# Patient Record
Sex: Female | Born: 1955
Health system: Southern US, Community
[De-identification: ages and names within clinical notes are randomized; demographics above are authoritative.]

## PROBLEM LIST (undated history)

## (undated) ENCOUNTER — Ambulatory Visit (HOSPITAL_COMMUNITY): Source: Home / Self Care

## (undated) DIAGNOSIS — I499 Cardiac arrhythmia, unspecified: Secondary | ICD-10-CM

## (undated) DIAGNOSIS — I214 Non-ST elevation (NSTEMI) myocardial infarction: Secondary | ICD-10-CM

## (undated) DIAGNOSIS — I10 Essential (primary) hypertension: Secondary | ICD-10-CM

## (undated) DIAGNOSIS — E785 Hyperlipidemia, unspecified: Secondary | ICD-10-CM

## (undated) HISTORY — DX: Non-ST elevation (NSTEMI) myocardial infarction: I21.4

## (undated) HISTORY — PX: FOOT SURGERY: SHX648

## (undated) HISTORY — DX: Cardiac arrhythmia, unspecified: I49.9

## (undated) HISTORY — DX: Essential (primary) hypertension: I10

## (undated) HISTORY — DX: Hyperlipidemia, unspecified: E78.5

---

## 2010-04-18 ENCOUNTER — Emergency Department (HOSPITAL_COMMUNITY): Admission: EM | Admit: 2010-04-18 | Discharge: 2010-04-18 | Payer: Self-pay | Admitting: Emergency Medicine

## 2011-02-24 LAB — CBC
MCHC: 34.1 g/dL (ref 30.0–36.0)
MCV: 88.2 fL (ref 78.0–100.0)
RDW: 13.8 % (ref 11.5–15.5)

## 2011-02-24 LAB — BASIC METABOLIC PANEL
BUN: 13 mg/dL (ref 6–23)
Calcium: 9.5 mg/dL (ref 8.4–10.5)
Creatinine, Ser: 1.03 mg/dL (ref 0.4–1.2)
GFR calc non Af Amer: 56 mL/min — ABNORMAL LOW (ref >60)
Glucose, Bld: 96 mg/dL (ref 70–99)

## 2011-02-24 LAB — DIFFERENTIAL
Basophils Relative: 1 % (ref 0–1)
Eosinophils Absolute: 0 10*3/uL (ref 0.0–0.7)
Lymphocytes Relative: 39 % (ref 12–46)
Lymphs Abs: 1.7 10*3/uL (ref 0.7–4.0)
Monocytes Absolute: 0.4 10*3/uL (ref 0.1–1.0)
Monocytes Relative: 11 % (ref 3–12)
Neutrophils Relative %: 49 % (ref 43–77)

## 2011-02-24 LAB — POCT CARDIAC MARKERS: Troponin i, poc: <0.05 ng/mL (ref 0.00–0.09)

## 2018-10-18 DIAGNOSIS — I456 Pre-excitation syndrome: Secondary | ICD-10-CM | POA: Diagnosis not present

## 2018-10-18 DIAGNOSIS — H6502 Acute serous otitis media, left ear: Secondary | ICD-10-CM | POA: Diagnosis not present

## 2018-10-18 DIAGNOSIS — I471 Supraventricular tachycardia: Secondary | ICD-10-CM | POA: Diagnosis not present

## 2018-10-18 DIAGNOSIS — Z131 Encounter for screening for diabetes mellitus: Secondary | ICD-10-CM | POA: Diagnosis not present

## 2018-10-18 DIAGNOSIS — J069 Acute upper respiratory infection, unspecified: Secondary | ICD-10-CM | POA: Diagnosis not present

## 2018-10-18 DIAGNOSIS — Z113 Encounter for screening for infections with a predominantly sexual mode of transmission: Secondary | ICD-10-CM | POA: Diagnosis not present

## 2018-10-18 DIAGNOSIS — I10 Essential (primary) hypertension: Secondary | ICD-10-CM | POA: Diagnosis not present

## 2018-10-18 DIAGNOSIS — R000 Tachycardia, unspecified: Secondary | ICD-10-CM | POA: Diagnosis not present

## 2018-10-18 DIAGNOSIS — E7800 Pure hypercholesterolemia, unspecified: Secondary | ICD-10-CM | POA: Diagnosis not present

## 2018-10-25 DIAGNOSIS — I471 Supraventricular tachycardia: Secondary | ICD-10-CM | POA: Diagnosis not present

## 2018-10-25 DIAGNOSIS — I10 Essential (primary) hypertension: Secondary | ICD-10-CM | POA: Diagnosis not present

## 2018-11-01 DIAGNOSIS — E7800 Pure hypercholesterolemia, unspecified: Secondary | ICD-10-CM | POA: Diagnosis not present

## 2018-11-01 DIAGNOSIS — I119 Hypertensive heart disease without heart failure: Secondary | ICD-10-CM | POA: Diagnosis not present

## 2018-11-01 DIAGNOSIS — I471 Supraventricular tachycardia: Secondary | ICD-10-CM | POA: Diagnosis not present

## 2018-11-01 DIAGNOSIS — I456 Pre-excitation syndrome: Secondary | ICD-10-CM | POA: Diagnosis not present

## 2018-11-01 DIAGNOSIS — I10 Essential (primary) hypertension: Secondary | ICD-10-CM | POA: Diagnosis not present

## 2018-11-01 DIAGNOSIS — Z6830 Body mass index (BMI) 30.0-30.9, adult: Secondary | ICD-10-CM | POA: Diagnosis not present

## 2018-11-25 DIAGNOSIS — I472 Ventricular tachycardia: Secondary | ICD-10-CM | POA: Diagnosis not present

## 2019-01-26 DIAGNOSIS — R7303 Prediabetes: Secondary | ICD-10-CM | POA: Diagnosis not present

## 2019-01-26 DIAGNOSIS — R309 Painful micturition, unspecified: Secondary | ICD-10-CM | POA: Diagnosis not present

## 2019-01-26 DIAGNOSIS — L853 Xerosis cutis: Secondary | ICD-10-CM | POA: Diagnosis not present

## 2019-01-26 DIAGNOSIS — I10 Essential (primary) hypertension: Secondary | ICD-10-CM | POA: Diagnosis not present

## 2019-02-25 NOTE — Progress Notes (Deleted)
Subjective:  Primary Physician:  No primary care provider on file.  Patient ID: Veronica RoughenBrenda Wanat, female    DOB: [birth year], 63 y.o.   MRN: [id number]  No chief complaint on file.   HPI: Veronica Bowen  is a 63 y.o. female, came for follow-up for Supraventricular tachycardia. [name] is 63 years old African-American female. She was seen for the first time on 10/18/18 when patient was having PSVT run continuously for more than one week. She was having shortness of breath with PSVT. Carotid massage was tried but it did not work. Patient was then given IV adenosine in the office and she converted to sinus rhythm.  Patient is feeling well now. In her own words "I feel a whole lot better". She did not have any further palpitation, rapid heart beat since the last visit. No c/o dizziness, near-syncope or syncope. No complaints of chest pain, shortness of breath, orthopnea or PND.  Patient has history of rapid heartbeat off and on for many years, spells had been rare and short lasting. Once, she had long spell of rapid heartbeat, patient went to ER and was diagnosed as PSVT, treated with IV medication.  No complaints of swelling on the legs, no claudication.  No history of diabetes. She has hypertension. She also has history of hypercholesterolemia, patient says that I took medicines in the past but then stopped on her own. She does not smoke. She does not drink coffee or other caffeine containing beverages and no history of taking decongestants.   No history of thyroid problems. No history of TIA or CVA. ***  No past medical history on file.  *** The histories are not reviewed yet. Please review them in the "History" navigator section and refresh this SmartLink.  Social History   Socioeconomic History  . Marital status: Single    Spouse name: Not on file  . Number of children: Not on file  . Years of education: Not on file  . Highest education level: Not on file  Occupational  History  . Not on file  Social Needs  . Financial resource strain: Not on file  . Food insecurity:    Worry: Not on file    Inability: Not on file  . Transportation needs:    Medical: Not on file    Non-medical: Not on file  Tobacco Use  . Smoking status: Not on file  Substance and Sexual Activity  . Alcohol use: Not on file  . Drug use: Not on file  . Sexual activity: Not on file  Lifestyle  . Physical activity:    Days per week: Not on file    Minutes per session: Not on file  . Stress: Not on file  Relationships  . Social connections:    Talks on phone: Not on file    Gets together: Not on file    Attends religious service: Not on file    Active member of club or organization: Not on file    Attends meetings of clubs or organizations: Not on file    Relationship status: Not on file  . Intimate partner violence:    Fear of current or ex partner: Not on file    Emotionally abused: Not on file    Physically abused: Not on file    Forced sexual activity: Not on file  Other Topics Concern  . Not on file  Social History Narrative  . Not on file    No current outpatient medications on  file prior to visit.   No current facility-administered medications on file prior to visit.     ***Review of Systems  Constitutional: Negative for fever.  HENT: Negative for nosebleeds.   Eyes: Negative for blurred vision.  Respiratory: Negative for cough.   Gastrointestinal: Negative for abdominal pain, nausea and vomiting.  Genitourinary: Negative for dysuria.  Musculoskeletal: Negative for myalgias.  Skin: Negative for itching and rash.  Neurological: Negative for dizziness and loss of consciousness.  Endo/Heme/Allergies: Does not bruise/bleed easily.  Psychiatric/Behavioral: The patient is not nervous/anxious.        Objective:  There were no vitals taken for this visit. There is no height or weight on file to calculate BMI.  ***Physical Exam  Constitutional: She is  oriented to person, place, and time. She appears well-developed and well-nourished.  HENT:  Head: Normocephalic and atraumatic.  Eyes: Pupils are equal, round, and reactive to light.  Neck: No thyromegaly present.  Cardiovascular: Normal rate, regular rhythm and normal heart sounds. Exam reveals no gallop.  No murmur heard. Pulses:      Carotid pulses are 2+ on the right side and 2+ on the left side.      Femoral pulses are 2+ on the right side and 2+ on the left side.      Dorsalis pedis pulses are 2+ on the right side and 2+ on the left side.       Posterior tibial pulses are 2+ on the right side and 2+ on the left side.  Pulmonary/Chest: She has no wheezes. She has no rales.  Abdominal: She exhibits no mass. There is no abdominal tenderness.  Musculoskeletal:        General: No edema.  Lymphadenopathy:    She has no cervical adenopathy.  Neurological: She is alert and oriented to person, place, and time.  Skin: Skin is warm.    CARDIAC STUDIES:  Echocardiogram 10/25/2018: Left ventricle cavity is normal in size. Moderate concentric hypertrophy of the left ventricle. Normal global wall motion. Doppler evidence of grade I (impaired) diastolic dysfunction, normal LAP. Calculated EF 55%. Occasional PVC noted. Trileaflet aortic valve with mild (Grade I) regurgitation. Mild calcification of the aortic valve annulus. Mild (Grade I) mitral regurgitation. Mild mitral valve leaflet thickening. Trace tricuspid regurgitation. Unable to estimate PA pressure due to absence/minimal TR signal.  Treadmill exercise stress test 11/25/2018: Indication: SVT The patient exercised on Bruce protocol for 04:36 min. Patient achieved 6.57 METS and reached HR 125 bpm, which is 79 % of maximum age-predicted HR. Stress test terminated due to fatigue.  Resting EKG demonstrates Normal sinus rhythm. ST Changes: With peak exercise there was no ST-T changes of ischemia. Arrhythmias: none. Chest Pain: none. BP  Response to Exercise: Normal resting BP- appropriate response. HR Response to Exercise: Exaggerated HR response. Exercise capacity was below average for age. Recommendations: Continue primary/secondary prevention.   Assessment & Recommendations:   There are no diagnoses linked to this encounter.  Recommendation: *** Since patient had such long spell of SVT lasting for one week with symptoms of shortness of breath, has short PR interval on EKG. I have recommended ablation. It was explained to the patient. I will refer her for EP consult after stress test.  Primary prevention was again discussed with her. She was advised to follow low-salt, low-cholesterol diet. She was also advised calorie restriction to lose weight.  I will see her in follow-up after 3 months, but patient was advised to call us if there are any  spells of rapid heartbeat or any other cardiac problems in the interim.  Earl Many, MD, [facility] 02/25/2019, 9:01 PM [facility] [place] Pager: [phone] Office: [phone] If no answer Cell [phone]

## 2019-02-27 ENCOUNTER — Ambulatory Visit: Payer: Self-pay | Admitting: Cardiology

## 2019-02-27 DIAGNOSIS — I10 Essential (primary) hypertension: Secondary | ICD-10-CM | POA: Diagnosis not present

## 2019-02-27 DIAGNOSIS — R7303 Prediabetes: Secondary | ICD-10-CM | POA: Diagnosis not present

## 2019-03-30 DIAGNOSIS — Z7189 Other specified counseling: Secondary | ICD-10-CM | POA: Diagnosis not present

## 2019-03-30 DIAGNOSIS — I10 Essential (primary) hypertension: Secondary | ICD-10-CM | POA: Diagnosis not present

## 2019-03-30 DIAGNOSIS — R7303 Prediabetes: Secondary | ICD-10-CM | POA: Diagnosis not present

## 2019-06-23 DIAGNOSIS — R7303 Prediabetes: Secondary | ICD-10-CM | POA: Diagnosis not present

## 2019-06-23 DIAGNOSIS — I10 Essential (primary) hypertension: Secondary | ICD-10-CM | POA: Diagnosis not present

## 2019-06-23 DIAGNOSIS — D7289 Other specified disorders of white blood cells: Secondary | ICD-10-CM | POA: Diagnosis not present

## 2019-06-23 DIAGNOSIS — D72819 Decreased white blood cell count, unspecified: Secondary | ICD-10-CM | POA: Diagnosis not present

## 2019-06-23 DIAGNOSIS — E782 Mixed hyperlipidemia: Secondary | ICD-10-CM | POA: Diagnosis not present

## 2019-10-24 DIAGNOSIS — R7303 Prediabetes: Secondary | ICD-10-CM | POA: Diagnosis not present

## 2019-10-24 DIAGNOSIS — H6691 Otitis media, unspecified, right ear: Secondary | ICD-10-CM | POA: Diagnosis not present

## 2019-10-24 DIAGNOSIS — I10 Essential (primary) hypertension: Secondary | ICD-10-CM | POA: Diagnosis not present

## 2019-10-24 DIAGNOSIS — J029 Acute pharyngitis, unspecified: Secondary | ICD-10-CM | POA: Diagnosis not present

## 2019-10-24 DIAGNOSIS — E782 Mixed hyperlipidemia: Secondary | ICD-10-CM | POA: Diagnosis not present

## 2019-11-13 ENCOUNTER — Encounter: Payer: Self-pay | Admitting: Cardiology

## 2019-11-13 ENCOUNTER — Ambulatory Visit (INDEPENDENT_AMBULATORY_CARE_PROVIDER_SITE_OTHER): Payer: BC Managed Care – PPO | Admitting: Cardiology

## 2019-11-13 ENCOUNTER — Other Ambulatory Visit: Payer: Self-pay

## 2019-11-13 VITALS — BP 137/86 | HR 80 | Temp 97.2°F | Ht 65.0 in | Wt 188.6 lb

## 2019-11-13 DIAGNOSIS — E7800 Pure hypercholesterolemia, unspecified: Secondary | ICD-10-CM

## 2019-11-13 DIAGNOSIS — I471 Supraventricular tachycardia: Secondary | ICD-10-CM | POA: Diagnosis not present

## 2019-11-13 DIAGNOSIS — I10 Essential (primary) hypertension: Secondary | ICD-10-CM

## 2019-11-13 NOTE — Progress Notes (Signed)
Primary Physician:  Renaye RakersBland, Veita, MD   Patient ID: Veronica Bowen Haver, female    DOB: 1956-04-15, 63 y.o.   MRN: [id number]  Subjective:    Chief Complaint  Patient presents with  . Hypertension    HPI: Veronica Bowen  is a 63 y.o. female  with hypertension, hyperlipidemia, PSVT, last seen in November 2019 after she had prolonged episode of PSVT and was given IV adenosine in the office which converted to sinus rhythm.  She was lost to follow-up, but decided to make an appointment today to be seen as she had not been seen by us in some time.  She states that she is doing well, no complaints today.  She has not had any episodes of palpitations, rapid heartbeat, dizziness, or syncope.  She denies any chest pain or shortness of breath.  She states that she has recently seen her PCP and was started on Lipitor.  Past Medical History:  Diagnosis Date  . Hyperlipidemia   . Hypertension     Past Surgical History:  Procedure Laterality Date  . CESAREAN SECTION  1989    Social History   Socioeconomic History  . Marital status: Single    Spouse name: Not on file  . Number of children: 2  . Years of education: Not on file  . Highest education level: Not on file  Occupational History  . Not on file  Social Needs  . Financial resource strain: Not on file  . Food insecurity    Worry: Not on file    Inability: Not on file  . Transportation needs    Medical: Not on file    Non-medical: Not on file  Tobacco Use  . Smoking status: Never Smoker  . Smokeless tobacco: Never Used  Substance and Sexual Activity  . Alcohol use: Not on file    Comment: occ wine  . Drug use: Never  . Sexual activity: Not on file  Lifestyle  . Physical activity    Days per week: Not on file    Minutes per session: Not on file  . Stress: Not on file  Relationships  . Social Musicianconnections    Talks on phone: Not on file    Gets together: Not on file    Attends religious service: Not on file    Active member  of club or organization: Not on file    Attends meetings of clubs or organizations: Not on file    Relationship status: Not on file  . Intimate partner violence    Fear of current or ex partner: Not on file    Emotionally abused: Not on file    Physically abused: Not on file    Forced sexual activity: Not on file  Other Topics Concern  . Not on file  Social History Narrative  . Not on file    Review of Systems  Constitution: Negative for decreased appetite, malaise/fatigue, weight gain and weight loss.  Eyes: Negative for visual disturbance.  Cardiovascular: Negative for chest pain, claudication, dyspnea on exertion, leg swelling, orthopnea, palpitations and syncope.  Respiratory: Negative for hemoptysis and wheezing.   Endocrine: Negative for cold intolerance and heat intolerance.  Hematologic/Lymphatic: Does not bruise/bleed easily.  Skin: Negative for nail changes.  Musculoskeletal: Negative for muscle weakness and myalgias.  Gastrointestinal: Negative for abdominal pain, change in bowel habit, nausea and vomiting.  Neurological: Negative for difficulty with concentration, dizziness, focal weakness and headaches.  Psychiatric/Behavioral: Negative for altered mental status and suicidal ideas.  All other systems reviewed and are negative.     Objective:  Blood pressure 137/86, pulse 80, temperature (!) 97.2 F (36.2 C), height 5\' 5"  (1.651 m), weight 188 lb 9.6 oz (85.5 kg), SpO2 96 %. Body mass index is 31.38 kg/m.    Physical Exam  Constitutional: She is oriented to person, place, and time. Vital signs are normal. She appears well-developed and well-nourished.  HENT:  Head: Normocephalic and atraumatic.  Neck: Normal range of motion.  Cardiovascular: Normal rate, regular rhythm, normal heart sounds and intact distal pulses.  Pulmonary/Chest: Effort normal and breath sounds normal. No accessory muscle usage. No respiratory distress.  Abdominal: Soft. Bowel sounds are  normal.  Musculoskeletal: Normal range of motion.  Neurological: She is alert and oriented to person, place, and time.  Skin: Skin is warm and dry.  Vitals reviewed.  Radiology: No results found.  Laboratory examination:    CMP Latest Ref Rng & Units 04/18/2010  Glucose 70 - 99 mg/dL 96  BUN 6 - 23 mg/dL 13  Creatinine 0.4 - 1.2 mg/dL 04/20/2010  Sodium 2.53 - 664 mEq/L 141  Potassium 3.5 - 5.1 mEq/L 4.1  Chloride 96 - 112 mEq/L 106  CO2 19 - 32 mEq/L 29  Calcium 8.4 - 10.5 mg/dL 9.5   CBC Latest Ref Rng & Units 04/18/2010  WBC 4.0 - 10.5 K/uL 4.3  Hemoglobin 12.0 - 15.0 g/dL 04/20/2010  Hematocrit 47.4 - 46.0 % 40.0  Platelets 150 - 400 K/uL 179   Lipid Panel  No results found for: CHOL, TRIG, HDL, CHOLHDL, VLDL, LDLCALC, LDLDIRECT HEMOGLOBIN A1C No results found for: HGBA1C, MPG TSH No results for input(s): TSH in the last 8760 hours.  PRN Meds:. There are no discontinued medications. Current Meds  Medication Sig  . atorvastatin (LIPITOR) 40 MG tablet Take 40 mg by mouth at bedtime.  25.9 losartan-hydrochlorothiazide (HYZAAR) 50-12.5 MG tablet Take 1 tablet by mouth daily.    Cardiac Studies:   Echocardiogram 10/25/2018: Left ventricle cavity is normal in size. Moderate concentric hypertrophy of the left ventricle. Normal global wall motion. Doppler evidence of grade I (impaired) diastolic dysfunction, normal LAP. Calculated EF 55%. Occasional PVC noted. Trileaflet aortic valve with mild (Grade I) regurgitation. Mild calcification of the aortic valve annulus. Mild (Grade I) mitral regurgitation. Mild mitral valve leaflet thickening. Trace tricuspid regurgitation. Unable to estimate PA pressure due to absence/minimal TR signal.  Treadmill exercise stress test 11/25/2018: Indication: SVT The patient exercised on Bruce protocol for  04:36 min. Patient achieved  6.57 METS and reached HR  125 bpm, which is  79 % of maximum age-predicted HR.  Stress test terminated due to fatigue.    Resting EKG demonstrates Normal sinus rhythm. ST Changes: With peak exercise there was no ST-T changes of ischemia. Arrhythmias: none. Chest Pain: none.  BP Response to Exercise: Normal resting BP- appropriate response. HR Response to Exercise: Exaggerated HR response. Exercise capacity was below average for age. Recommendations: Continue primary/secondary prevention.  Assessment:   PSVT (paroxysmal supraventricular tachycardia) (HCC)  Essential hypertension - Plan: EKG 12-Lead  Hypercholesteremia  EKG 11/13/2019: Normal sinus rhythm at 78 bpm, normal axis, no evidence of ischemia.   Recommendations:   Patient is here for follow-up for hypertension and PSVT.  She has not had any known recurrence of SVT.  It appears that she is not currently on metoprolol, do feel that she would benefit from this given her history of SVT.  I will refill her prescription.  Discussed  use of vagal maneuvers should she have recurrence of SVT.  Continue with losartan hydrochlorothiazide.  She denies any chest pain or shortness of breath.  Physical exam is unremarkable and EKG is without any acute abnormalities.  Encouraged her to continue to work on her diet and start regular exercise to help with her weight.  She is on Lipitor for hyperlipidemia that is being managed by her PCP.  I will request recent labs from PCP office for our records.  I recommended that she keep continued regular follow-ups with her PCP.  I will see her back in 6 months for follow-up, but encouraged her to contact me sooner if needed.  Miquel Dunn, MSN, APRN, FNP-C [facility]. [place] Office: [phone] Fax: [phone]

## 2019-11-14 ENCOUNTER — Encounter: Payer: Self-pay | Admitting: Cardiology

## 2019-11-14 MED ORDER — METOPROLOL SUCCINATE ER 50 MG PO TB24: 50.0000 mg | ORAL_TABLET | Freq: Every day | ORAL | 3 refills | Status: DC

## 2020-01-29 DIAGNOSIS — E782 Mixed hyperlipidemia: Secondary | ICD-10-CM | POA: Diagnosis not present

## 2020-01-29 DIAGNOSIS — R7303 Prediabetes: Secondary | ICD-10-CM | POA: Diagnosis not present

## 2020-01-29 DIAGNOSIS — I10 Essential (primary) hypertension: Secondary | ICD-10-CM | POA: Diagnosis not present

## 2020-02-26 DIAGNOSIS — R519 Headache, unspecified: Secondary | ICD-10-CM | POA: Diagnosis not present

## 2020-02-26 DIAGNOSIS — Z03818 Encounter for observation for suspected exposure to other biological agents ruled out: Secondary | ICD-10-CM | POA: Diagnosis not present

## 2020-02-26 DIAGNOSIS — Z20828 Contact with and (suspected) exposure to other viral communicable diseases: Secondary | ICD-10-CM | POA: Diagnosis not present

## 2020-02-26 DIAGNOSIS — I10 Essential (primary) hypertension: Secondary | ICD-10-CM | POA: Diagnosis not present

## 2020-04-30 DIAGNOSIS — E782 Mixed hyperlipidemia: Secondary | ICD-10-CM | POA: Diagnosis not present

## 2020-04-30 DIAGNOSIS — R7303 Prediabetes: Secondary | ICD-10-CM | POA: Diagnosis not present

## 2020-04-30 DIAGNOSIS — I10 Essential (primary) hypertension: Secondary | ICD-10-CM | POA: Diagnosis not present

## 2020-05-13 ENCOUNTER — Ambulatory Visit: Payer: BC Managed Care – PPO | Admitting: Cardiology

## 2020-06-07 ENCOUNTER — Encounter (HOSPITAL_COMMUNITY): Payer: Self-pay

## 2020-06-07 ENCOUNTER — Emergency Department (HOSPITAL_COMMUNITY): Payer: BC Managed Care – PPO

## 2020-06-07 ENCOUNTER — Inpatient Hospital Stay (HOSPITAL_COMMUNITY)
Admission: EM | Admit: 2020-06-07 | Discharge: 2020-06-12 | DRG: 247 | Disposition: A | Discharge status: Home / Self Care | Payer: BC Managed Care – PPO | Attending: Internal Medicine | Admitting: Internal Medicine

## 2020-06-07 ENCOUNTER — Other Ambulatory Visit: Payer: Self-pay

## 2020-06-07 DIAGNOSIS — R079 Chest pain, unspecified: Secondary | ICD-10-CM

## 2020-06-07 DIAGNOSIS — N3000 Acute cystitis without hematuria: Secondary | ICD-10-CM | POA: Diagnosis not present

## 2020-06-07 DIAGNOSIS — I471 Supraventricular tachycardia, unspecified: Secondary | ICD-10-CM

## 2020-06-07 DIAGNOSIS — Z79899 Other long term (current) drug therapy: Secondary | ICD-10-CM

## 2020-06-07 DIAGNOSIS — I214 Non-ST elevation (NSTEMI) myocardial infarction: Secondary | ICD-10-CM | POA: Diagnosis not present

## 2020-06-07 DIAGNOSIS — R778 Other specified abnormalities of plasma proteins: Secondary | ICD-10-CM

## 2020-06-07 DIAGNOSIS — Z20822 Contact with and (suspected) exposure to covid-19: Secondary | ICD-10-CM | POA: Diagnosis present

## 2020-06-07 DIAGNOSIS — I10 Essential (primary) hypertension: Secondary | ICD-10-CM

## 2020-06-07 DIAGNOSIS — I2510 Atherosclerotic heart disease of native coronary artery without angina pectoris: Secondary | ICD-10-CM | POA: Diagnosis present

## 2020-06-07 DIAGNOSIS — E119 Type 2 diabetes mellitus without complications: Secondary | ICD-10-CM | POA: Diagnosis present

## 2020-06-07 DIAGNOSIS — E7800 Pure hypercholesterolemia, unspecified: Secondary | ICD-10-CM | POA: Diagnosis not present

## 2020-06-07 DIAGNOSIS — N390 Urinary tract infection, site not specified: Secondary | ICD-10-CM

## 2020-06-07 DIAGNOSIS — I110 Hypertensive heart disease with heart failure: Secondary | ICD-10-CM | POA: Diagnosis present

## 2020-06-07 DIAGNOSIS — R3915 Urgency of urination: Secondary | ICD-10-CM | POA: Diagnosis not present

## 2020-06-07 DIAGNOSIS — B9689 Other specified bacterial agents as the cause of diseases classified elsewhere: Secondary | ICD-10-CM | POA: Diagnosis not present

## 2020-06-07 DIAGNOSIS — I5032 Chronic diastolic (congestive) heart failure: Secondary | ICD-10-CM | POA: Diagnosis not present

## 2020-06-07 DIAGNOSIS — N3090 Cystitis, unspecified without hematuria: Secondary | ICD-10-CM | POA: Diagnosis not present

## 2020-06-07 DIAGNOSIS — E785 Hyperlipidemia, unspecified: Secondary | ICD-10-CM | POA: Diagnosis not present

## 2020-06-07 DIAGNOSIS — R7989 Other specified abnormal findings of blood chemistry: Secondary | ICD-10-CM

## 2020-06-07 DIAGNOSIS — N3001 Acute cystitis with hematuria: Secondary | ICD-10-CM | POA: Diagnosis not present

## 2020-06-07 DIAGNOSIS — R0789 Other chest pain: Secondary | ICD-10-CM | POA: Diagnosis not present

## 2020-06-07 DIAGNOSIS — Z955 Presence of coronary angioplasty implant and graft: Secondary | ICD-10-CM

## 2020-06-07 DIAGNOSIS — J9811 Atelectasis: Secondary | ICD-10-CM | POA: Diagnosis not present

## 2020-06-07 HISTORY — DX: Urinary tract infection, site not specified: N39.0

## 2020-06-07 HISTORY — DX: Supraventricular tachycardia, unspecified: I47.10

## 2020-06-07 HISTORY — DX: Chest pain, unspecified: R07.9

## 2020-06-07 HISTORY — DX: Supraventricular tachycardia: I47.1

## 2020-06-07 LAB — BASIC METABOLIC PANEL
Anion gap: 9 (ref 5–15)
BUN: 11 mg/dL (ref 8–23)
CO2: 27 mmol/L (ref 22–32)
Calcium: 9.7 mg/dL (ref 8.9–10.3)
Chloride: 104 mmol/L (ref 98–111)
Creatinine, Ser: 1.08 mg/dL — ABNORMAL HIGH (ref 0.44–1.00)
GFR calc Af Amer: >60 mL/min (ref >60)
GFR calc non Af Amer: 55 mL/min — ABNORMAL LOW (ref >60)
Glucose, Bld: 107 mg/dL — ABNORMAL HIGH (ref 70–99)
Potassium: 3.6 mmol/L (ref 3.5–5.1)
Sodium: 140 mmol/L (ref 135–145)

## 2020-06-07 LAB — URINALYSIS, ROUTINE W REFLEX MICROSCOPIC
Bilirubin Urine: NEGATIVE
Glucose, UA: NEGATIVE mg/dL
Ketones, ur: NEGATIVE mg/dL
Nitrite: POSITIVE — AB
Protein, ur: 30 mg/dL — AB
Specific Gravity, Urine: 1.017 (ref 1.005–1.030)
WBC, UA: >50 WBC/hpf — ABNORMAL HIGH (ref 0–5)
pH: 5 (ref 5.0–8.0)

## 2020-06-07 LAB — TROPONIN I (HIGH SENSITIVITY)
Troponin I (High Sensitivity): 35 ng/L — ABNORMAL HIGH (ref <18)
Troponin I (High Sensitivity): 678 ng/L (ref <18)

## 2020-06-07 LAB — CBC
HCT: 40.7 % (ref 36.0–46.0)
Hemoglobin: 12.8 g/dL (ref 12.0–15.0)
MCH: 27.7 pg (ref 26.0–34.0)
MCHC: 31.4 g/dL (ref 30.0–36.0)
MCV: 88.1 fL (ref 80.0–100.0)
Platelets: 199 10*3/uL (ref 150–400)
RBC: 4.62 MIL/uL (ref 3.87–5.11)
RDW: 13.3 % (ref 11.5–15.5)
WBC: 5.8 10*3/uL (ref 4.0–10.5)
nRBC: 0 % (ref 0.0–0.2)

## 2020-06-07 LAB — MRSA PCR SCREENING: MRSA by PCR: NEGATIVE

## 2020-06-07 LAB — SARS CORONAVIRUS 2 BY RT PCR (HOSPITAL ORDER, PERFORMED IN ~~LOC~~ HOSPITAL LAB): SARS Coronavirus 2: NEGATIVE

## 2020-06-07 MED ORDER — LOSARTAN POTASSIUM 50 MG PO TABS
50.0000 mg | ORAL_TABLET | Freq: Every day | ORAL | Status: DC
  Administered 2020-06-08 – 2020-06-12 (×4): 50 mg via ORAL
  Filled 2020-06-07 (×5): qty 1

## 2020-06-07 MED ORDER — CEPHALEXIN 500 MG PO CAPS
500.0000 mg | ORAL_CAPSULE | Freq: Two times a day (BID) | ORAL | Status: DC
  Administered 2020-06-07 – 2020-06-10 (×6): 500 mg via ORAL
  Filled 2020-06-07 (×6): qty 1

## 2020-06-07 MED ORDER — ROSUVASTATIN CALCIUM 20 MG PO TABS
40.0000 mg | ORAL_TABLET | Freq: Every day | ORAL | Status: DC
  Administered 2020-06-08 – 2020-06-12 (×4): 40 mg via ORAL
  Filled 2020-06-07 (×5): qty 2

## 2020-06-07 MED ORDER — HYDROCHLOROTHIAZIDE 25 MG PO TABS
12.5000 mg | ORAL_TABLET | Freq: Every day | ORAL | Status: DC
  Administered 2020-06-08 – 2020-06-12 (×4): 12.5 mg via ORAL
  Filled 2020-06-07 (×5): qty 1

## 2020-06-07 MED ORDER — HEPARIN BOLUS VIA INFUSION
4000.0000 [IU] | Freq: Once | INTRAVENOUS | Status: AC
  Administered 2020-06-07: 4000 [IU] via INTRAVENOUS
  Filled 2020-06-07: qty 4000

## 2020-06-07 MED ORDER — SODIUM CHLORIDE 0.9% FLUSH: 3.0000 mL | Freq: Once | INTRAVENOUS | Status: DC

## 2020-06-07 MED ORDER — ASPIRIN 81 MG PO CHEW
81.0000 mg | CHEWABLE_TABLET | Freq: Every day | ORAL | Status: DC
  Administered 2020-06-08 – 2020-06-12 (×4): 81 mg via ORAL
  Filled 2020-06-07 (×4): qty 1

## 2020-06-07 MED ORDER — ASPIRIN 81 MG PO CHEW
324.0000 mg | CHEWABLE_TABLET | Freq: Once | ORAL | Status: AC
  Administered 2020-06-07: 324 mg via ORAL
  Filled 2020-06-07: qty 4

## 2020-06-07 MED ORDER — NITROGLYCERIN 0.4 MG SL SUBL
0.4000 mg | SUBLINGUAL_TABLET | SUBLINGUAL | Status: DC | PRN
  Administered 2020-06-07: 0.4 mg via SUBLINGUAL
  Filled 2020-06-07: qty 1

## 2020-06-07 MED ORDER — METOPROLOL TARTRATE 25 MG PO TABS
25.0000 mg | ORAL_TABLET | Freq: Two times a day (BID) | ORAL | Status: DC
  Administered 2020-06-07 – 2020-06-12 (×9): 25 mg via ORAL
  Filled 2020-06-07 (×10): qty 1

## 2020-06-07 MED ORDER — ASPIRIN 81 MG PO CHEW: 324.0000 mg | CHEWABLE_TABLET | Freq: Once | ORAL | Status: DC

## 2020-06-07 MED ORDER — HEPARIN (PORCINE) 25000 UT/250ML-% IV SOLN
700.0000 [IU]/h | INTRAVENOUS | Status: DC
  Administered 2020-06-07: 1000 [IU]/h via INTRAVENOUS
  Administered 2020-06-08: 750 [IU]/h via INTRAVENOUS
  Administered 2020-06-10: 700 [IU]/h via INTRAVENOUS
  Filled 2020-06-07 (×3): qty 250

## 2020-06-07 NOTE — ED Triage Notes (Signed)
Pt arrives to ED w/ 5/10 intermittent, centrally located, non-radiating chest pain that started yesterday. Pt states pain is worse when taking a deep breath.

## 2020-06-07 NOTE — ED Provider Notes (Signed)
[facility] EMERGENCY DEPARTMENT Provider Note   CSN: [id number] Arrival date & time: 06/07/20  1501     History Chief Complaint  Patient presents with  . Chest Pain    Veronica Bowen is a 64 y.o. female presenting to ED with epigastric chest pain.  Onset while at rest yesterday.  Feels like heart burn but more persistent.  Never had these symptoms before.  Does not radiate.  Intensity now 3/10, has settled down over the past day but never gone away completely.    No hx of ACS or MI.  No family hx significant.  Pt has diabetes, HLD, HTN.  Not a smoker.  Saw cardiology 6 months ago per chart review.  Last stress test in 2019 unremarkable.  She seperately reports dysuria for several days and feels she may have a UTI.  HPI     Past Medical History:  Diagnosis Date  . Arrhythmia   . Hyperlipidemia   . Hypertension     Patient Active Problem List   Diagnosis Date Noted  . Hypertension 06/07/2020  . Hyperlipidemia 06/07/2020  . Urinary tract infection 06/07/2020  . Chest pain 06/07/2020  . Supraventricular tachycardia (HCC) 06/07/2020  . NSTEMI (non-ST elevated myocardial infarction) [facility])     Past Surgical History:  Procedure Laterality Date  . CESAREAN SECTION  1989  . FOOT SURGERY Right    hammer toes  both left and right     OB History   No obstetric history on file.     Family History  Problem Relation Age of Onset  . Diabetes Mother   . Diabetes Father   . Diabetes Sister     Social History   Tobacco Use  . Smoking status: Never Smoker  . Smokeless tobacco: Never Used  Vaping Use  . Vaping Use: Never used  Substance Use Topics  . Alcohol use: Not on file    Comment: occ wine  . Drug use: Never    Home Medications Prior to Admission medications   Medication Sig Start Date End Date Taking? Authorizing Provider  losartan-hydrochlorothiazide (HYZAAR) 50-12.5 MG tablet Take 1 tablet by mouth daily.   Yes [provider]  rosuvastatin (CRESTOR) 40 MG tablet Take 40 mg by mouth daily. 06/03/20  Yes [provider]  atorvastatin (LIPITOR) 40 MG tablet Take 40 mg by mouth at bedtime. Patient not taking: Reported on 06/07/2020    [provider]  metoprolol succinate (TOPROL-XL) 50 MG 24 hr tablet Take 1 tablet (50 mg total) by mouth daily. Take with or immediately following a meal. 11/14/19 02/12/20  Toniann Fail, NP    Allergies    Patient has no known allergies.  Review of Systems   Review of Systems  Constitutional: Negative for chills and fever.  HENT: Negative for ear pain and sore throat.   Eyes: Negative for pain and visual disturbance.  Respiratory: Negative for cough and shortness of breath.   Cardiovascular: Positive for chest pain. Negative for palpitations.  Gastrointestinal: Negative for abdominal pain and vomiting.  Genitourinary: Negative for dysuria and hematuria.  Musculoskeletal: Negative for arthralgias and back pain.  Skin: Negative for color change and rash.  Neurological: Negative for syncope and light-headedness.  Psychiatric/Behavioral: Negative for agitation and confusion.  All other systems reviewed and are negative.   Physical Exam Updated Vital Signs BP 126/72   Pulse 65   Temp 98 F (36.7 C) (Oral)   Resp 20   Ht 5'  5" (1.651 m)   Wt 89.7 kg   SpO2 97%   BMI 32.91 kg/m   Physical Exam Vitals and nursing note reviewed.  Constitutional:      General: She is not in acute distress.    Appearance: She is well-developed.  HENT:     Head: Normocephalic and atraumatic.  Eyes:     Conjunctiva/sclera: Conjunctivae normal.  Cardiovascular:     Rate and Rhythm: Normal rate and regular rhythm.     Heart sounds: No murmur heard.   Pulmonary:     Effort: Pulmonary effort is normal. No respiratory distress.     Breath sounds: Normal breath sounds.  Abdominal:     Palpations: Abdomen is soft.     Tenderness: There is no abdominal tenderness.    Musculoskeletal:        General: Normal range of motion.     Cervical back: Neck supple.     Right lower leg: No edema.     Left lower leg: No edema.  Skin:    General: Skin is warm and dry.  Neurological:     General: No focal deficit present.     Mental Status: She is alert and oriented to person, place, and time.  Psychiatric:        Mood and Affect: Mood normal.        Behavior: Behavior normal.     ED Results / Procedures / Treatments   Labs (all labs ordered are listed, but only abnormal results are displayed) Labs Reviewed  BASIC METABOLIC PANEL - Abnormal; Notable for the following components:      Result Value   Glucose, Bld 107 (*)    Creatinine, Ser 1.08 (*)    GFR calc non Af Amer 55 (*)    All other components within normal limits  URINALYSIS, ROUTINE W REFLEX MICROSCOPIC - Abnormal; Notable for the following components:   APPearance HAZY (*)    Hgb urine dipstick MODERATE (*)    Protein, ur 30 (*)    Nitrite POSITIVE (*)    Leukocytes,Ua SMALL (*)    WBC, UA >50 (*)    Bacteria, UA MANY (*)    All other components within normal limits  TROPONIN I (HIGH SENSITIVITY) - Abnormal; Notable for the following components:   Troponin I (High Sensitivity) 35 (*)    All other components within normal limits  TROPONIN I (HIGH SENSITIVITY) - Abnormal; Notable for the following components:   Troponin I (High Sensitivity) 678 (*)    All other components within normal limits  SARS CORONAVIRUS 2 BY RT PCR (HOSPITAL ORDER, PERFORMED IN  HOSPITAL LAB)  MRSA PCR SCREENING  URINE CULTURE  CBC  HEMOGLOBIN A1C  TSH  LIPID PANEL  HEPARIN LEVEL (UNFRACTIONATED)  CBC    EKG EKG Interpretation  Date/Time:  Friday June 07 2020 15:10:53 EDT Ventricular Rate:  84 PR Interval:  140 QRS Duration: 82 QT Interval:  348 QTC Calculation: 411 R Axis:   8 Text Interpretation: Normal sinus rhythm Normal ECG No STEMI Confirmed by Alvester Chou [phone]) on 06/07/2020  6:27:10 PM   Radiology DG Chest 2 View  Result Date: 06/07/2020 CLINICAL DATA:  Chest pain. EXAM: CHEST - 2 VIEW COMPARISON:  Chest x-ray dated Apr 18, 2010. FINDINGS: The heart size and mediastinal contours are within normal limits. Normal pulmonary vascularity. Mild left basilar atelectasis. No focal consolidation, pleural effusion, or pneumothorax. No acute osseous abnormality. IMPRESSION: 1. Mild left basilar atelectasis. Electronically Signed  By: Obie Dredge M.D.   On: 06/07/2020 15:56    Procedures Procedures (including critical care time)  Medications Ordered in ED Medications  sodium chloride flush (NS) 0.9 % injection 3 mL (3 mLs Intravenous Not Given 06/07/20 1917)  nitroGLYCERIN (NITROSTAT) SL tablet 0.4 mg (0.4 mg Sublingual Given 06/07/20 1922)  cephALEXin (KEFLEX) capsule 500 mg (500 mg Oral Given 06/07/20 2250)  rosuvastatin (CRESTOR) tablet 40 mg (has no administration in time range)  hydrochlorothiazide (HYDRODIURIL) tablet 12.5 mg (has no administration in time range)  aspirin chewable tablet 81 mg (has no administration in time range)  metoprolol tartrate (LOPRESSOR) tablet 25 mg (25 mg Oral Given 06/07/20 2153)  losartan (COZAAR) tablet 50 mg (has no administration in time range)  heparin ADULT infusion 100 units/mL (25000 units/225mL sodium chloride 0.45%) (1,000 Units/hr Intravenous New Bag/Given 06/07/20 2152)  aspirin chewable tablet 324 mg (324 mg Oral Given 06/07/20 1928)  heparin bolus via infusion 4,000 Units (4,000 Units Intravenous Bolus from Bag 06/07/20 2150)    ED Course  I have reviewed the triage vital signs and the nursing notes.  Pertinent labs & imaging results that were available during my care of the patient were reviewed by me and considered in my medical decision making (see chart for details).  64 yo female presenting with chest pain x 2 days.  Pain 3/10 on assessment.  ECG on arrival is not acutely ischemic.  However troponin became elevated from 35  to > 600 on repeat.  Concerning for developing NSTEMI.  Cardiology consulted after initial troponin noted at 35.  SL nitro and aspirin given to patient with resolution of chest pain completely. IV heparin ordered by cardiology fellow after admission to cardiology service.  Less likely PE, PTX, PNA at this time. Labs personally reviewed Medical record reviewed, prior stress test DG chest unremarkable.  Will also start keflex for UTI.  Doubt sepsis  Clinical Course as of Jun 08 149  Fri Jun 07, 2020  2012 Nitrite(!): POSITIVE [MT]  2032 Admitted to cardiology team, fellow to see patient, initiated treatment for UTI   [MT]  2037 Dr Early Osmond cardiology fellow at bedside now, patient pain free, he was made aware of trop elevation, he will likely be ordering heparin   [MT]    Clinical Course User Index [MT] Pinchos Topel, Kermit Balo, MD    Final Clinical Impression(s) / ED Diagnoses Final diagnoses:  Chest pain, unspecified type  Elevated troponin  Cystitis    Rx / DC Orders ED Discharge Orders    None       Terald Sleeper, MD 06/08/20 [phone]

## 2020-06-07 NOTE — Progress Notes (Signed)
ANTICOAGULATION CONSULT NOTE  Pharmacy Consult for heparin Indication: chest pain/ACS  No Known Allergies  Patient Measurements: Height: 5\' 5"  (165.1 cm) Weight: 86.2 kg (190 lb) IBW/kg (Calculated) : 57 Heparin Dosing Weight: 86kg  Vital Signs: Temp: 98.5 F (36.9 C) (07/02 1841) Temp Source: Oral (07/02 1841) BP: 150/76 (07/02 2045) Pulse Rate: 56 (07/02 2045)  Labs: Recent Labs    06/07/20 1517 06/07/20 1929  HGB 12.8  --   HCT 40.7  --   PLT 199  --   CREATININE 1.08*  --   TROPONINIHS 35* 678*    Estimated Creatinine Clearance: 57.8 mL/min (A) (by C-G formula based on SCr of 1.08 mg/dL (H)).   Medical History: Past Medical History:  Diagnosis Date  . Arrhythmia   . Hyperlipidemia   . Hypertension      Assessment: 89 yoF admitted with CP and rising troponins. Pharmacy to dose IV heparin. Baseline CBC wnl, no AC PTA.  Goal of Therapy:  Heparin level 0.3-0.7 units/ml Monitor platelets by anticoagulation protocol: Yes   Plan:  -Heparin 4000 units x1 -Heparin 1000 units/hr -Check 6-hr heparin level -Monitor heparin level, CBC, S/Sx bleeding daily  64, PharmD, BCPS Clinical Pharmacist [phone] Please check AMION for all [facility] Pharmacy numbers 06/07/2020

## 2020-06-07 NOTE — H&P (Signed)
Cardiology Admission History and Physical:   Patient ID: Veronica Bowen MRN: [id number]; DOB: [birth year]   Admission date: 06/07/2020  Primary Care Provider: Renaye RakersBland, Veita, MD [facility] Cardiologist: No primary care provider on file.  [facility] Electrophysiologist:  None   Chief Complaint: chest pain  Patient Profile:   83F with HTN, HLD, and pSVT who presents with chest pain.   History of Present Illness:   Ms. Veronica Bowen reports that around 11:00 AM on 07/01 she had acute onset chest pressure that came out of nowhere and was not similar to anything she experienced before.  She initially thought she may have indigestion however she has no history of reflux in the past.  She had not eaten anything that morning and tried to drink some water intake several times with no relief.  She feels that she has a fairly high pain tolerance and this pain was at least 7/10 in severity.  It did not radiate and was persistent on the center of her chest.  This occurred while she was laying on the couch watching TV.  She said it was fairly significant for at least 30 minutes to maybe an hour and then she tried to ignore it and felt that it was more waxing and waning.  Over the following several hours it decreased to a 4/10 in severity.  She took multiple doses of Tums without any significant relief.  She felt like the pain has somewhat subsided by dinnertime at which point she had a friend over.  During dinner she felt the pain was still coming back and so she laid down back of the couch and eventually fell asleep.  Unfortunately on 07/0 2 AM the pain was still present although less severe.  She has history of SVT which has caused her to have acute onset shortness of breath and noticeable palpitations in the past.  She did not feel any of these episodes leading up to her episode of chest pressure/pain.  Laid on chair and had coffee  persitsent chest pain   crestor 40 mg PO qhs  toprol XL 50 mg PO daily    Retired from [employer]   Past Medical History:  Diagnosis Date  . Arrhythmia   . Hyperlipidemia   . Hypertension    Past Surgical History:  Procedure Laterality Date  . CESAREAN SECTION  1989    Medications Prior to Admission: Prior to Admission medications   Medication Sig Start Date End Date Taking? Authorizing Provider  losartan-hydrochlorothiazide (HYZAAR) 50-12.5 MG tablet Take 1 tablet by mouth daily.   Yes [provider]  rosuvastatin (CRESTOR) 40 MG tablet Take 40 mg by mouth daily. 06/03/20  Yes [provider]  atorvastatin (LIPITOR) 40 MG tablet Take 40 mg by mouth at bedtime. Patient not taking: Reported on 06/07/2020    [provider]  metoprolol succinate (TOPROL-XL) 50 MG 24 hr tablet Take 1 tablet (50 mg total) by mouth daily. Take with or immediately following a meal. 11/14/19 02/12/20  Toniann FailKelley, Ashton Haynes, NP    Allergies:   No Known Allergies  Social History:   Social History   Socioeconomic History  . Marital status: Single    Spouse name: Not on file  . Number of children: 2  . Years of education: Not on file  . Highest education level: Not on file  Occupational History  . Not on file  Tobacco Use  . Smoking status: Never Smoker  . Smokeless tobacco: Never Used  Vaping Use  .  Vaping Use: Never used  Substance and Sexual Activity  . Alcohol use: Not on file    Comment: occ wine  . Drug use: Never  . Sexual activity: Not on file  Other Topics Concern  . Not on file  Social History Narrative  . Not on file   Social Determinants of Health   Financial Resource Strain:   . Difficulty of Paying Living Expenses:   Food Insecurity:   . Worried About Programme researcher, broadcasting/film/video in the Last Year:   . Barista in the Last Year:   Transportation Needs:   . Freight forwarder (Medical):   Marland Kitchen Lack of Transportation (Non-Medical):   Physical Activity:   . Days of Exercise per Week:   . Minutes of Exercise per Session:    Stress:   . Feeling of Stress :   Social Connections:   . Frequency of Communication with Friends and Family:   . Frequency of Social Gatherings with Friends and Family:   . Attends Religious Services:   . Active Member of Clubs or Organizations:   . Attends Banker Meetings:   Marland Kitchen Marital Status:   Intimate Partner Violence:   . Fear of Current or Ex-Partner:   . Emotionally Abused:   Marland Kitchen Physically Abused:   . Sexually Abused:     Family History:  The patient's family history includes Diabetes in her father, mother, and sister.   No family hx of SCD or premature CAD.   ROS:   Review of Systems: [y] = yes, [ ]  = no       General: Weight gain [ ] ; Weight loss [ ] ; Anorexia [ ] ; Fatigue [ ] ; Fever [ ] ; Chills [ ] ; Weakness [ ]     Cardiac: Chest pain/pressure [y]; Resting SOB [ ] ; Exertional SOB [ ] ; Orthopnea [ ] ; Pedal Edema [ ] ; Palpitations [ ] ; Syncope [ ] ; Presyncope [ ] ; Paroxysmal nocturnal dyspnea [ ]     Pulmonary: Cough [ ] ; Wheezing [ ] ; Hemoptysis [ ] ; Sputum [ ] ; Snoring [ ]     GI: Vomiting [ ] ; Dysphagia [ ] ; Melena [ ] ; Hematochezia [ ] ; Heartburn [ ] ; Abdominal pain [ ] ; Constipation [ ] ; Diarrhea [ ] ; BRBPR [ ]     GU: Hematuria [ ] ; Dysuria [ ] ; Nocturia [ ]   Vascular: Pain in legs with walking [ ] ; Pain in feet with lying flat [ ] ; Non-healing sores [ ] ; Stroke [ ] ; TIA [ ] ; Slurred speech [ ] ;    Neuro: Headaches [ ] ; Vertigo [ ] ; Seizures [ ] ; Paresthesias [ ] ;Blurred vision [ ] ; Diplopia [ ] ; Vision changes [ ]     Ortho/Skin: Arthritis [ ] ; Joint pain [ ] ; Muscle pain [ ] ; Joint swelling [ ] ; Back Pain [ ] ; Rash [ ]     Psych: Depression [ ] ; Anxiety [ ]     Heme: Bleeding problems [ ] ; Clotting disorders [ ] ; Anemia [ ]     Endocrine: Diabetes [ ] ; Thyroid dysfunction [ ]    Physical Exam/Data:   Vitals:   06/07/20 1514 06/07/20 1758 06/07/20 1841  BP: (!) 170/95 (!) 167/87 (!) 156/81  Pulse: 80 62 77  Resp: 16 15 19   Temp: 99 F (37.2  C)  98.5 F (36.9 C)  TempSrc: Oral  Oral  SpO2: 100% 99% 99%  Weight: 86.2 kg    Height: 5\' 5"  (1.651 m)     No intake or output data in the  24 hours ending 06/07/20 1919 Last 3 Weights 06/07/2020 11/13/2019  Weight (lbs) 190 lb 188 lb 9.6 oz  Weight (kg) 86.183 kg 85.548 kg     Body mass index is 31.62 kg/m.  General:  Well nourished, well developed, in no acute distress HEENT: normal Lymph: no adenopathy Neck: no JVD Endocrine:  No thryomegaly Vascular: No carotid bruits; FA pulses 2+ bilaterally without bruits  Cardiac:  normal S1, S2; RRR; no murmur  Lungs:  clear to auscultation bilaterally, no wheezing, rhonchi or rales  Abd: soft, nontender, no hepatomegaly  Ext: no edema Musculoskeletal:  No deformities, BUE and BLE strength normal and equal Skin: warm and dry  Neuro:  CNs 2-12 intact, no focal abnormalities noted Psych:  Normal affect   EKG:  The ECG that was personally reviewed and demonstrates NSR/no STE  Relevant CV Studies:  TTE Result date: 10/25/18 Left ventricle cavity is normal in size. Moderate concentric hypertrophy of the left ventricle. Normal global wall motion. Doppler evidence of grade I (impaired) diastolic dysfunction, normal LAP. Calculated EF 55%. Occasional PVC noted. Trileaflet aortic valve with mild (Grade I) regurgitation. Mild calcification of the aortic valve annulus. Mild (Grade I) mitral regurgitation. Mild mitral valve leaflet thickening. Trace tricuspid regurgitation. Unable to estimate PA pressure due to absence/minimal TR signal.  Treadmill exercise stress test  Result date: 11/25/18 Indication: SVT The patient exercised on Bruce protocol for  04:36 min. Patient achieved  6.57 METS and reached HR  125 bpm, which is  79 % of maximum age-predicted HR.  Stress test terminated due to fatigue.   Resting EKG demonstrates Normal sinus rhythm. ST Changes: With peak exercise there was no ST-T changes of ischemia. Arrhythmias: none. Chest  Pain: none.  BP Response to Exercise: Normal resting BP- appropriate response. HR Response to Exercise: Exaggerated HR response. Exercise capacity was below average for age. Recommendations: Continue primary/secondary prevention.  Laboratory Data:  High Sensitivity Troponin:   Recent Labs  Lab 06/07/20 1517  TROPONINIHS 35*      Chemistry Recent Labs  Lab 06/07/20 1517  NA 140  K 3.6  CL 104  CO2 27  GLUCOSE 107*  BUN 11  CREATININE 1.08*  CALCIUM 9.7  GFRNONAA 55*  GFRAA >60  ANIONGAP 9    No results for input(s): PROT, ALBUMIN, AST, ALT, ALKPHOS, BILITOT in the last 168 hours.   Hematology Recent Labs  Lab 06/07/20 1517  WBC 5.8  RBC 4.62  HGB 12.8  HCT 40.7  MCV 88.1  MCH 27.7  MCHC 31.4  RDW 13.3  PLT 199   BNPNo results for input(s): BNP, PROBNP in the last 168 hours.  DDimer No results for input(s): DDIMER in the last 168 hours.  Radiology/Studies:  DG Chest 2 View  Result Date: 06/07/2020 CLINICAL DATA:  Chest pain. EXAM: CHEST - 2 VIEW COMPARISON:  Chest x-ray dated Apr 18, 2010. FINDINGS: The heart size and mediastinal contours are within normal limits. Normal pulmonary vascularity. Mild left basilar atelectasis. No focal consolidation, pleural effusion, or pneumothorax. No acute osseous abnormality. IMPRESSION: 1. Mild left basilar atelectasis. Electronically Signed   By: Obie Dredge M.D.   On: 06/07/2020 15:56   HEART score (6)  Assessment and Plan:  60F with HTN, HLD, and pSVT who presents with chest pain found to have mildly elevated troponin and UTI admitted for further evaluation.   1. NSTEMI Mr. Bowen presented with chest pain and only mildly elevated troponins initially (35 at 1517) however on  recheck significantly increased (678 at 1929). She has no history of reflux or other causes of chest pain.  She has risk factors including uncontrolled hypertension and hyperlipidemia along with a story consistent with anginal CP. She did have a  relatively unremarkable exercise stress test in 2019 when she was in [place] however she only went 4 minutes/6.5 METS on Bruce but only achieved 79% of predicted heart rate.  The stress test was terminated secondary to fatigue with no ST changes seen during peak exercise.  Test was performed for evaluation of SVT not anginal chest pain. She was on toprol XL 50 mg PO daily and crestor 40 mg PO qhs however toprol was d/c and she was started on losartan-HCTZ by her PCP the last month. She is currently CP free.  - s/p ASA 324 mg PO load - start ASA 81 mg PO daily - start metoprolol tartrate 25 mg PO bid - continue home crestor 40 mg PO qhs - contiunue home losartan-HCTZ 50-12.5 mg PO daily - heparin gtt  - lipid panel, TSH, A1c, trend trops - NPO MN for cors eval +/- PCI  2. HTN - continue home losartan-HCTZ 50-12.5 mg PO daily - will need uptitration by PCP, took today and still HTN >160  3. HLD - repeat lipid panel  4. SVT - no recent recurrence, on metop for NSTEMI  5. UTI Reports urinary frequency, urgency and bladder pressure over past 2 days.  - continue keflex x5 days  Severity of Illness: The appropriate patient status for this patient is INPATIENT. Inpatient status is judged to be reasonable and necessary in order to provide the required intensity of service to ensure the patient's safety. The patient's presenting symptoms, physical exam findings, and initial radiographic and laboratory data in the context of their chronic comorbidities is felt to place them at high risk for further clinical deterioration. Furthermore, it is not anticipated that the patient will be medically stable for discharge from the hospital within 2 midnights of admission. The following factors support the patient status of inpatient.   " The patient's presenting symptoms include chest pressure " The worrisome physical exam findings include n/a " The initial radiographic and laboratory data are worrisome  because of elevated troponins " The chronic co-morbidities include HLD, HTN, SVT  * I certify that at the point of admission it is my clinical judgment that the patient will require inpatient hospital care spanning beyond 2 midnights from the point of admission due to high intensity of service, high risk for further deterioration and high frequency of surveillance required.*   For questions or updates, please contact [facility] Please consult [email or web address] for contact info under   Signed, Linton Rump, MD  06/07/2020 7:19 PM

## 2020-06-08 DIAGNOSIS — I214 Non-ST elevation (NSTEMI) myocardial infarction: Principal | ICD-10-CM

## 2020-06-08 LAB — HEPARIN LEVEL (UNFRACTIONATED)
Heparin Unfractionated: 0.91 IU/mL — ABNORMAL HIGH (ref 0.30–0.70)
Heparin Unfractionated: 0.9 IU/mL — ABNORMAL HIGH (ref 0.30–0.70)
Heparin Unfractionated: 0.65 IU/mL (ref 0.30–0.70)

## 2020-06-08 LAB — CBC
HCT: 38.2 % (ref 36.0–46.0)
Hemoglobin: 12 g/dL (ref 12.0–15.0)
MCH: 27.5 pg (ref 26.0–34.0)
MCHC: 31.4 g/dL (ref 30.0–36.0)
MCV: 87.4 fL (ref 80.0–100.0)
Platelets: 184 10*3/uL (ref 150–400)
RBC: 4.37 MIL/uL (ref 3.87–5.11)
RDW: 13.5 % (ref 11.5–15.5)
WBC: 6.3 10*3/uL (ref 4.0–10.5)
nRBC: 0 % (ref 0.0–0.2)

## 2020-06-08 LAB — TROPONIN I (HIGH SENSITIVITY): Troponin I (High Sensitivity): 832 ng/L (ref <18)

## 2020-06-08 LAB — HEMOGLOBIN A1C
Hgb A1c MFr Bld: 6.1 % — ABNORMAL HIGH (ref 4.8–5.6)
Mean Plasma Glucose: 128.37 mg/dL

## 2020-06-08 LAB — LIPID PANEL
Cholesterol: 121 mg/dL (ref 0–200)
HDL: 47 mg/dL (ref >40)
LDL Cholesterol: 59 mg/dL (ref 0–99)
Total CHOL/HDL Ratio: 2.6 RATIO
Triglycerides: 74 mg/dL (ref <150)
VLDL: 15 mg/dL (ref 0–40)

## 2020-06-08 LAB — TSH: TSH: 3.061 u[IU]/mL (ref 0.350–4.500)

## 2020-06-08 MED ORDER — SODIUM CHLORIDE 0.9 % WEIGHT BASED INFUSION: 3.0000 mL/kg/h | INTRAVENOUS | Status: DC

## 2020-06-08 MED ORDER — SODIUM CHLORIDE 0.9% FLUSH: 3.0000 mL | INTRAVENOUS | Status: DC | PRN

## 2020-06-08 MED ORDER — ASPIRIN 81 MG PO CHEW
81.0000 mg | CHEWABLE_TABLET | ORAL | Status: AC
  Administered 2020-06-11: 81 mg via ORAL
  Filled 2020-06-08: qty 1

## 2020-06-08 MED ORDER — SODIUM CHLORIDE 0.9 % WEIGHT BASED INFUSION: 1.0000 mL/kg/h | INTRAVENOUS | Status: DC

## 2020-06-08 MED ORDER — SODIUM CHLORIDE 0.9% FLUSH
3.0000 mL | Freq: Two times a day (BID) | INTRAVENOUS | Status: DC
  Administered 2020-06-08 – 2020-06-11 (×4): 3 mL via INTRAVENOUS

## 2020-06-08 MED ORDER — ASPIRIN 81 MG PO CHEW: 81.0000 mg | CHEWABLE_TABLET | ORAL | Status: DC

## 2020-06-08 MED ORDER — SODIUM CHLORIDE 0.9 % WEIGHT BASED INFUSION
1.0000 mL/kg/h | INTRAVENOUS | Status: DC
  Administered 2020-06-11 (×2): 1 mL/kg/h via INTRAVENOUS

## 2020-06-08 MED ORDER — SODIUM CHLORIDE 0.9 % IV SOLN: 250.0000 mL | INTRAVENOUS | Status: DC | PRN

## 2020-06-08 NOTE — Progress Notes (Signed)
ANTICOAGULATION CONSULT NOTE  Pharmacy Consult for Heparin Indication: chest pain/ACS  No Known Allergies  Patient Measurements: Height: 5\' 5"  (165.1 cm) Weight: 89.7 kg (197 lb 12 oz) IBW/kg (Calculated) : 57 Heparin Dosing Weight: 86kg  Vital Signs: Temp: 97.7 F (36.5 C) (07/03 0329) Temp Source: Oral (07/03 0329) BP: 124/62 (07/03 0329) Pulse Rate: 64 (07/03 0329)  Labs: Recent Labs    06/07/20 1517 06/07/20 1929 06/08/20 0224 06/08/20 0238  HGB 12.8  --  12.0  --   HCT 40.7  --  38.2  --   PLT 199  --  184  --   HEPARINUNFRC  --   --  0.91*  --   CREATININE 1.08*  --   --   --   TROPONINIHS 35* 678*  --  832*    Estimated Creatinine Clearance: 59 mL/min (A) (by C-G formula based on SCr of 1.08 mg/dL (H)).   Medical History: Past Medical History:  Diagnosis Date  . Arrhythmia   . Hyperlipidemia   . Hypertension      Assessment: 41 yoF admitted with CP and rising troponins. Pharmacy to dose IV heparin. Baseline CBC wnl, no AC PTA.  7/3 AM update:  Heparin level elevated  Drawn a little early  Goal of Therapy:  Heparin level 0.3-0.7 units/ml Monitor platelets by anticoagulation protocol: Yes   Plan:  Dec heparin to 900 units/hr Re-check heparin level at 1200  64, PharmD, BCPS Clinical Pharmacist Phone: [phone]

## 2020-06-08 NOTE — Progress Notes (Signed)
ANTICOAGULATION CONSULT NOTE  Pharmacy Consult for Heparin Indication: chest pain/ACS  No Known Allergies  Patient Measurements: Height: 5\' 5"  (165.1 cm) Weight: 89.7 kg (197 lb 12 oz) IBW/kg (Calculated) : 57 Heparin Dosing Weight: 86kg  Vital Signs: Temp: 98.1 F (36.7 C) (07/03 1915) Temp Source: Oral (07/03 1915) BP: 114/72 (07/03 1915) Pulse Rate: 70 (07/03 1915)  Labs: Recent Labs    06/07/20 1517 06/07/20 1929 06/08/20 0224 06/08/20 0238 06/08/20 1117 06/08/20 2134  HGB 12.8  --  12.0  --   --   --   HCT 40.7  --  38.2  --   --   --   PLT 199  --  184  --   --   --   HEPARINUNFRC  --   --  0.91*  --  0.90* 0.65  CREATININE 1.08*  --   --   --   --   --   TROPONINIHS 35* 678*  --  832*  --   --     Estimated Creatinine Clearance: 59 mL/min (A) (by C-G formula based on SCr of 1.08 mg/dL (H)).   Medical History: Past Medical History:  Diagnosis Date  . Arrhythmia   . Hyperlipidemia   . Hypertension      Assessment: 63 yoF on heparin for NSTEMI. Baseline CBC wnl, no AC PTA.  Patient refused lab draw at 1900, reordered for 2100. Heparin level down to therapeutic range.   Goal of Therapy:  Heparin level 0.3-0.7 units/ml Monitor platelets by anticoagulation protocol: Yes   Plan:  Continue heparin infusion 750 units/hr Monitor daily HL, CBC, for s/sx of bleeding    2101, PharmD, BCPS, BCCP Clinical Pharmacist  Please check AMION for all [facility] Pharmacy phone numbers After 10:00 PM, call Main Pharmacy [phone]

## 2020-06-08 NOTE — Progress Notes (Addendum)
Progress Note  Patient Name: Veronica Bowen Date of Encounter: 06/08/2020  Primary Cardiologist:   No primary care provider on file.   Subjective   No chest pain.    Inpatient Medications    Scheduled Meds: . aspirin  81 mg Oral Daily  . cephALEXin  500 mg Oral Q12H  . hydrochlorothiazide  12.5 mg Oral Daily  . losartan  50 mg Oral Daily  . metoprolol tartrate  25 mg Oral BID  . rosuvastatin  40 mg Oral Daily  . sodium chloride flush  3 mL Intravenous Once   Continuous Infusions: . heparin 900 Units/hr (06/08/20 0440)   PRN Meds: nitroGLYCERIN   Vital Signs    Vitals:   06/08/20 0000 06/08/20 0300 06/08/20 0329 06/08/20 0805  BP: 126/72  124/62 130/75  Pulse: 65 63 64 64  Resp: 20 19 19 18   Temp:   97.7 F (36.5 C) 98.2 F (36.8 C)  TempSrc:   Oral Oral  SpO2: 97% 97% 96% 95%  Weight:      Height:        Intake/Output Summary (Last 24 hours) at 06/08/2020 0911 Last data filed at 06/08/2020 0700 Gross per 24 hour  Intake 608.43 ml  Output --  Net 608.43 ml   Filed Weights   06/07/20 1514 06/07/20 2200  Weight: 86.2 kg 89.7 kg    Telemetry    NSR - Personally Reviewed  ECG    NA - Personally Reviewed  Physical Exam   GEN: No acute distress.   Neck: No  JVD Cardiac: RRR, no murmurs, rubs, or gallops.  Respiratory: Clear to auscultation bilaterally. GI: Soft, nontender, non-distended  MS: No  edema; No deformity. Neuro:  Nonfocal  Psych: Normal affect   Labs    Chemistry Recent Labs  Lab 06/07/20 1517  NA 140  K 3.6  CL 104  CO2 27  GLUCOSE 107*  BUN 11  CREATININE 1.08*  CALCIUM 9.7  GFRNONAA 55*  GFRAA >60  ANIONGAP 9     Hematology Recent Labs  Lab 06/07/20 1517 06/08/20 0224  WBC 5.8 6.3  RBC 4.62 4.37  HGB 12.8 12.0  HCT 40.7 38.2  MCV 88.1 87.4  MCH 27.7 27.5  MCHC 31.4 31.4  RDW 13.3 13.5  PLT 199 184    Cardiac EnzymesNo results for input(s): TROPONINI in the last 168 hours. No results for input(s):  TROPIPOC in the last 168 hours.   BNPNo results for input(s): BNP, PROBNP in the last 168 hours.   DDimer No results for input(s): DDIMER in the last 168 hours.   Radiology    DG Chest 2 View  Result Date: 06/07/2020 CLINICAL DATA:  Chest pain. EXAM: CHEST - 2 VIEW COMPARISON:  Chest x-ray dated Apr 18, 2010. FINDINGS: The heart size and mediastinal contours are within normal limits. Normal pulmonary vascularity. Mild left basilar atelectasis. No focal consolidation, pleural effusion, or pneumothorax. No acute osseous abnormality. IMPRESSION: 1. Mild left basilar atelectasis. Electronically Signed   By: Apr 20, 2010 M.D.   On: 06/07/2020 15:56    Cardiac Studies   ECHO:  Pending.   Patient Profile     64 y.o. female with HTN, HLD, and pSVT who presented  with chest pain.   Assessment & Plan    NSTEMI:   On ASA, beta blocker, heparin.  Will plan cath on Tuesday.  The patient understands that risks included but are not limited to stroke (1 in 1000), death (1 in  1000), kidney failure [usually temporary] (1 in 500), bleeding (1 in 200), allergic reaction [possibly serious] (1 in 200).  The patient understands and agrees to proceed.   SVT: History of this but none since admission.    HTN:  BP OK.     For questions or updates, please contact [facility] Please consult [email or web address] for contact info under Cardiology/STEMI.   Signed, Rollene Rotunda, MD  06/08/2020, 9:11 AM

## 2020-06-08 NOTE — Progress Notes (Signed)
FYI, Pt had been [place] [facility] but Her cardiologist no longer there and she was told she needed new cardiologist.  Now with out group

## 2020-06-08 NOTE — Progress Notes (Signed)
Patient refusing heparin draw at this time.  Call to pharmacy that he will reschedule for 8pm

## 2020-06-08 NOTE — Progress Notes (Signed)
ANTICOAGULATION CONSULT NOTE  Pharmacy Consult for Heparin Indication: chest pain/ACS  No Known Allergies  Patient Measurements: Height: 5\' 5"  (165.1 cm) Weight: 89.7 kg (197 lb 12 oz) IBW/kg (Calculated) : 57 Heparin Dosing Weight: 86kg  Vital Signs: Temp: 98.3 F (36.8 C) (07/03 1152) Temp Source: Oral (07/03 1152) BP: 114/56 (07/03 1152) Pulse Rate: 60 (07/03 1152)  Labs: Recent Labs    06/07/20 1517 06/07/20 1929 06/08/20 0224 06/08/20 0238 06/08/20 1117  HGB 12.8  --  12.0  --   --   HCT 40.7  --  38.2  --   --   PLT 199  --  184  --   --   HEPARINUNFRC  --   --  0.91*  --  0.90*  CREATININE 1.08*  --   --   --   --   TROPONINIHS 35* 678*  --  832*  --     Estimated Creatinine Clearance: 59 mL/min (A) (by C-G formula based on SCr of 1.08 mg/dL (H)).   Medical History: Past Medical History:  Diagnosis Date  . Arrhythmia   . Hyperlipidemia   . Hypertension      Assessment: 61 yoF admitted with CP and rising troponins. Pharmacy to dose IV heparin. Baseline CBC wnl, no AC PTA.  Heparin level this morning came back supratherapeutic again at 0.9, despite rate reduction to 900 units/hr. Hgb stable at 12, plt 184. Trop increased to 832 this morning. Infusion running in R peripheral and level being drawn from opposite side.   Goal of Therapy:  Heparin level 0.3-0.7 units/ml Monitor platelets by anticoagulation protocol: Yes   Plan:  Decrease heparin infusion to 750 units/hr Check heparin level in 6 hours Monitor daily HL, CBC, for s/sx of bleeding  64, PharmD, BCCCP Clinical Pharmacist  Phone: [phone] 06/08/2020 12:35 PM  Please check AMION for all [facility] Pharmacy phone numbers After 10:00 PM, call Main Pharmacy [phone]

## 2020-06-09 ENCOUNTER — Inpatient Hospital Stay (HOSPITAL_COMMUNITY): Payer: BC Managed Care – PPO

## 2020-06-09 DIAGNOSIS — I214 Non-ST elevation (NSTEMI) myocardial infarction: Secondary | ICD-10-CM

## 2020-06-09 LAB — ECHOCARDIOGRAM COMPLETE
Height: 65 in
Weight: 3164.04 oz

## 2020-06-09 LAB — CBC
HCT: 39.9 % (ref 36.0–46.0)
Hemoglobin: 12.6 g/dL (ref 12.0–15.0)
MCH: 27.8 pg (ref 26.0–34.0)
MCHC: 31.6 g/dL (ref 30.0–36.0)
MCV: 88.1 fL (ref 80.0–100.0)
Platelets: 204 10*3/uL (ref 150–400)
RBC: 4.53 MIL/uL (ref 3.87–5.11)
RDW: 13.6 % (ref 11.5–15.5)
WBC: 4.9 10*3/uL (ref 4.0–10.5)
nRBC: 0 % (ref 0.0–0.2)

## 2020-06-09 LAB — BASIC METABOLIC PANEL
Anion gap: 9 (ref 5–15)
BUN: 17 mg/dL (ref 8–23)
CO2: 26 mmol/L (ref 22–32)
Calcium: 9.4 mg/dL (ref 8.9–10.3)
Chloride: 104 mmol/L (ref 98–111)
Creatinine, Ser: 0.88 mg/dL (ref 0.44–1.00)
GFR calc Af Amer: >60 mL/min (ref >60)
GFR calc non Af Amer: >60 mL/min (ref >60)
Glucose, Bld: 95 mg/dL (ref 70–99)
Potassium: 4.1 mmol/L (ref 3.5–5.1)
Sodium: 139 mmol/L (ref 135–145)

## 2020-06-09 LAB — HEPARIN LEVEL (UNFRACTIONATED): Heparin Unfractionated: 0.69 IU/mL (ref 0.30–0.70)

## 2020-06-09 MED ORDER — MELATONIN 3 MG PO TABS
3.0000 mg | ORAL_TABLET | Freq: Every evening | ORAL | Status: DC | PRN
  Administered 2020-06-09 – 2020-06-10 (×2): 3 mg via ORAL
  Filled 2020-06-09 (×2): qty 1

## 2020-06-09 NOTE — Progress Notes (Signed)
Progress Note  Patient Name: Veronica Bowen Date of Encounter: 06/09/2020  Primary Cardiologist:   Rollene Rotunda, MD   Subjective   No chest pain.  No SOB  Inpatient Medications    Scheduled Meds: . aspirin  81 mg Oral Daily  . [START ON 06/11/2020] aspirin  81 mg Oral Pre-Cath  . cephALEXin  500 mg Oral Q12H  . hydrochlorothiazide  12.5 mg Oral Daily  . losartan  50 mg Oral Daily  . metoprolol tartrate  25 mg Oral BID  . rosuvastatin  40 mg Oral Daily  . sodium chloride flush  3 mL Intravenous Once  . sodium chloride flush  3 mL Intravenous Q12H   Continuous Infusions: . sodium chloride    . [START ON 06/11/2020] sodium chloride     Followed by  . [START ON 06/11/2020] sodium chloride    . heparin 750 Units/hr (06/08/20 1953)   PRN Meds: sodium chloride, nitroGLYCERIN, sodium chloride flush   Vital Signs    Vitals:   06/08/20 1915 06/08/20 2304 06/09/20 0317 06/09/20 0740  BP: 114/72 (!) 107/56 (!) 102/56 (!) 116/58  Pulse: 70 60 61 (!) 55  Resp: 16 16 15 11   Temp: 98.1 F (36.7 C) 97.9 F (36.6 C) 97.9 F (36.6 C)   TempSrc: Oral Oral Oral   SpO2: 96% 98% 98% 96%  Weight:      Height:        Intake/Output Summary (Last 24 hours) at 06/09/2020 0755 Last data filed at 06/09/2020 0600 Gross per 24 hour  Intake 900.97 ml  Output --  Net 900.97 ml   Filed Weights   06/07/20 1514 06/07/20 2200  Weight: 86.2 kg 89.7 kg    Telemetry    NSR - Personally Reviewed  ECG    NA - Personally Reviewed  Physical Exam   GEN: No  acute distress.   Neck: No  JVD Cardiac: RRR, no murmurs, rubs, or gallops.  Respiratory: Clear   to auscultation bilaterally. GI: Soft, nontender, non-distended, normal bowel sounds  MS:  No edema; No deformity. Neuro:   Nonfocal  Psych: Oriented and appropriate    Labs    Chemistry Recent Labs  Lab 06/07/20 1517  NA 140  K 3.6  CL 104  CO2 27  GLUCOSE 107*  BUN 11  CREATININE 1.08*  CALCIUM 9.7  GFRNONAA 55*  GFRAA  >60  ANIONGAP 9     Hematology Recent Labs  Lab 06/07/20 1517 06/08/20 0224  WBC 5.8 6.3  RBC 4.62 4.37  HGB 12.8 12.0  HCT 40.7 38.2  MCV 88.1 87.4  MCH 27.7 27.5  MCHC 31.4 31.4  RDW 13.3 13.5  PLT 199 184    Cardiac EnzymesNo results for input(s): TROPONINI in the last 168 hours. No results for input(s): TROPIPOC in the last 168 hours.   BNPNo results for input(s): BNP, PROBNP in the last 168 hours.   DDimer No results for input(s): DDIMER in the last 168 hours.   Radiology    DG Chest 2 View  Result Date: 06/07/2020 CLINICAL DATA:  Chest pain. EXAM: CHEST - 2 VIEW COMPARISON:  Chest x-ray dated Apr 18, 2010. FINDINGS: The heart size and mediastinal contours are within normal limits. Normal pulmonary vascularity. Mild left basilar atelectasis. No focal consolidation, pleural effusion, or pneumothorax. No acute osseous abnormality. IMPRESSION: 1. Mild left basilar atelectasis. Electronically Signed   By: Apr 20, 2010 M.D.   On: 06/07/2020 15:56    Cardiac Studies  ECHO:  Pending.  (Ordered)   Patient Profile     64 y.o. female with HTN, HLD, and pSVT who presented  with chest pain.   Assessment & Plan    NSTEMI:   On ASA, beta blocker, heparin.  On board for Tuesday.    SVT: History of this.  Maintaining NSR.   HTN:  BP controlled.     For questions or updates, please contact [facility] Please consult [email or web address] for contact info under Cardiology/STEMI.   Signed, Rollene Rotunda, MD  06/09/2020, 7:55 AM

## 2020-06-09 NOTE — Plan of Care (Signed)
  Problem: Education: Goal: Understanding of cardiac disease, CV risk reduction, and recovery process will improve Outcome: Progressing Goal: Individualized Educational Video(s) Outcome: Progressing   Problem: Activity: Goal: Ability to tolerate increased activity will improve Outcome: Progressing   Problem: Cardiac: Goal: Ability to achieve and maintain adequate cardiovascular perfusion will improve Outcome: Progressing   Problem: Health Behavior/Discharge Planning: Goal: Ability to safely manage health-related needs after discharge will improve Outcome: Progressing   Problem: Education: Goal: Understanding of CV disease, CV risk reduction, and recovery process will improve Outcome: Progressing Goal: Individualized Educational Video(s) Outcome: Progressing   Problem: Activity: Goal: Ability to return to baseline activity level will improve Outcome: Progressing   Problem: Cardiovascular: Goal: Ability to achieve and maintain adequate cardiovascular perfusion will improve Outcome: Progressing Goal: Vascular access site(s) Level 0-1 will be maintained Outcome: Progressing   Problem: Health Behavior/Discharge Planning: Goal: Ability to safely manage health-related needs after discharge will improve Outcome: Progressing   

## 2020-06-09 NOTE — Progress Notes (Signed)
Pt refused lab draws this am.  Wants to have drawn at 0900.  Lab informed.  Karena Addison T

## 2020-06-09 NOTE — Progress Notes (Signed)
  Echocardiogram 2D Echocardiogram has been performed.  Veronica Bowen 06/09/2020, 2:25 PM

## 2020-06-09 NOTE — Progress Notes (Signed)
Education regarding cardiac catheterization on Tuesday morning given.  Questions answered.  Patient wishes to start 2nd IV on Monday.

## 2020-06-09 NOTE — Progress Notes (Signed)
ANTICOAGULATION CONSULT NOTE  Pharmacy Consult for Heparin Indication: chest pain/ACS  No Known Allergies  Patient Measurements: Height: 5\' 5"  (165.1 cm) Weight: 89.7 kg (197 lb 12 oz) IBW/kg (Calculated) : 57 Heparin Dosing Weight: 86kg  Vital Signs: Temp: 97.8 F (36.6 C) (07/04 0740) Temp Source: Oral (07/04 0740) BP: 116/58 (07/04 0740) Pulse Rate: 55 (07/04 0740)  Labs: Recent Labs    06/07/20 1517 06/07/20 1517 06/07/20 1929 06/08/20 0224 06/08/20 0224 06/08/20 0238 06/08/20 1117 06/08/20 2134 06/09/20 1056  HGB 12.8   < >  --  12.0  --   --   --   --  12.6  HCT 40.7  --   --  38.2  --   --   --   --  39.9  PLT 199  --   --  184  --   --   --   --  204  HEPARINUNFRC  --   --   --  0.91*   < >  --  0.90* 0.65 0.69  CREATININE 1.08*  --   --   --   --   --   --   --  0.88  TROPONINIHS 35*  --  678*  --   --  832*  --   --   --    < > = values in this interval not displayed.    Estimated Creatinine Clearance: 72.4 mL/min (by C-G formula based on SCr of 0.88 mg/dL).   Medical History: Past Medical History:  Diagnosis Date  . Arrhythmia   . Hyperlipidemia   . Hypertension      Assessment: 63 yoF on heparin for NSTEMI. Baseline CBC wnl, no AC PTA.  Heparin level came back therapeutic on higher end of goal at 0.69, on 750 units/hr. Hgb 12, plt 184. No s/sx of bleeding or infusion issues.  Goal of Therapy:  Heparin level 0.3-0.7 units/ml Monitor platelets by anticoagulation protocol: Yes   Plan:  Reduce heparin infusion slightly to 700 units/hr to keep in goal range Monitor daily HL, CBC, for s/sx of bleeding   08/10/20, PharmD, BCCCP Clinical Pharmacist  Phone: [phone] 06/09/2020 11:56 AM  Please check AMION for all [facility] Pharmacy phone numbers After 10:00 PM, call Main Pharmacy [phone]

## 2020-06-10 LAB — BASIC METABOLIC PANEL
Anion gap: 10 (ref 5–15)
BUN: 17 mg/dL (ref 8–23)
CO2: 26 mmol/L (ref 22–32)
Calcium: 9.6 mg/dL (ref 8.9–10.3)
Chloride: 101 mmol/L (ref 98–111)
Creatinine, Ser: 0.93 mg/dL (ref 0.44–1.00)
GFR calc Af Amer: >60 mL/min (ref >60)
GFR calc non Af Amer: >60 mL/min (ref >60)
Glucose, Bld: 127 mg/dL — ABNORMAL HIGH (ref 70–99)
Potassium: 4 mmol/L (ref 3.5–5.1)
Sodium: 137 mmol/L (ref 135–145)

## 2020-06-10 LAB — CBC
HCT: 40.2 % (ref 36.0–46.0)
Hemoglobin: 13 g/dL (ref 12.0–15.0)
MCH: 28.4 pg (ref 26.0–34.0)
MCHC: 32.3 g/dL (ref 30.0–36.0)
MCV: 87.8 fL (ref 80.0–100.0)
Platelets: 193 10*3/uL (ref 150–400)
RBC: 4.58 MIL/uL (ref 3.87–5.11)
RDW: 13.4 % (ref 11.5–15.5)
WBC: 4.3 10*3/uL (ref 4.0–10.5)
nRBC: 0 % (ref 0.0–0.2)

## 2020-06-10 LAB — URINE CULTURE: Culture: >=100000 — AB

## 2020-06-10 LAB — TROPONIN I (HIGH SENSITIVITY): Troponin I (High Sensitivity): 64 ng/L — ABNORMAL HIGH (ref <18)

## 2020-06-10 LAB — HEPARIN LEVEL (UNFRACTIONATED): Heparin Unfractionated: 0.5 IU/mL (ref 0.30–0.70)

## 2020-06-10 MED ORDER — CEFDINIR 300 MG PO CAPS
300.0000 mg | ORAL_CAPSULE | Freq: Two times a day (BID) | ORAL | Status: DC
  Administered 2020-06-10 – 2020-06-11 (×3): 300 mg via ORAL
  Filled 2020-06-10 (×4): qty 1

## 2020-06-10 NOTE — Progress Notes (Signed)
Progress Note  Patient Name: Veronica Bowen Date of Encounter: 06/10/2020  Primary Cardiologist:   Rollene Rotunda, MD   Subjective   No chest pain.  She is very upset about being here.    Inpatient Medications    Scheduled Meds: . aspirin  81 mg Oral Daily  . [START ON 06/11/2020] aspirin  81 mg Oral Pre-Cath  . cephALEXin  500 mg Oral Q12H  . hydrochlorothiazide  12.5 mg Oral Daily  . losartan  50 mg Oral Daily  . metoprolol tartrate  25 mg Oral BID  . rosuvastatin  40 mg Oral Daily  . sodium chloride flush  3 mL Intravenous Once  . sodium chloride flush  3 mL Intravenous Q12H   Continuous Infusions: . sodium chloride    . [START ON 06/11/2020] sodium chloride     Followed by  . [START ON 06/11/2020] sodium chloride    . heparin 700 Units/hr (06/10/20 0658)   PRN Meds: sodium chloride, melatonin, nitroGLYCERIN, sodium chloride flush   Vital Signs    Vitals:   06/09/20 1947 06/09/20 2330 06/10/20 0344 06/10/20 0733  BP: 100/72 130/63 (!) 100/57 (!) 110/53  Pulse: 67 (!) 55 66 60  Resp: 17 18 18 17   Temp: 98 F (36.7 C) (!) 97.5 F (36.4 C) 97.9 F (36.6 C) 98.1 F (36.7 C)  TempSrc: Oral Oral Oral Oral  SpO2: 96% 97% 99% 97%  Weight:   87 kg   Height:        Intake/Output Summary (Last 24 hours) at 06/10/2020 0834 Last data filed at 06/10/2020 0700 Gross per 24 hour  Intake 538.3 ml  Output --  Net 538.3 ml   Filed Weights   06/07/20 1514 06/07/20 2200 06/10/20 0344  Weight: 86.2 kg 89.7 kg 87 kg    Telemetry    NSR- Personally Reviewed  ECG    NA - Personally Reviewed  Physical Exam   GEN: No  acute distress.   Neck: No  JVD Cardiac: RRR, no murmurs, rubs, or gallops.  Respiratory: Clear   to auscultation bilaterally. GI: Soft, nontender, non-distended, normal bowel sounds  MS:  No edema; No deformity. Neuro:   Nonfocal  Psych: Oriented and appropriate    Labs    Chemistry Recent Labs  Lab 06/07/20 1517 06/09/20 1056  NA 140 139  K  3.6 4.1  CL 104 104  CO2 27 26  GLUCOSE 107* 95  BUN 11 17  CREATININE 1.08* 0.88  CALCIUM 9.7 9.4  GFRNONAA 55* >60  GFRAA >60 >60  ANIONGAP 9 9     Hematology Recent Labs  Lab 06/07/20 1517 06/08/20 0224 06/09/20 1056  WBC 5.8 6.3 4.9  RBC 4.62 4.37 4.53  HGB 12.8 12.0 12.6  HCT 40.7 38.2 39.9  MCV 88.1 87.4 88.1  MCH 27.7 27.5 27.8  MCHC 31.4 31.4 31.6  RDW 13.3 13.5 13.6  PLT 199 184 204    Cardiac EnzymesNo results for input(s): TROPONINI in the last 168 hours. No results for input(s): TROPIPOC in the last 168 hours.   BNPNo results for input(s): BNP, PROBNP in the last 168 hours.   DDimer No results for input(s): DDIMER in the last 168 hours.   Radiology    ECHOCARDIOGRAM COMPLETE  Result Date: 06/09/2020    ECHOCARDIOGRAM REPORT   Patient Name:   Veronica Bowen Date of Exam: 06/09/2020 Medical Rec #:  08/10/2020    Height:       65.0 in Accession #:  [id number]   Weight:       197.8 lb Date of Birth:  [birth year]    BSA:          1.969 m Patient Age:    63 years     BP:           114/82 mmHg Patient Gender: F            HR:           56 bpm. Exam Location:  Inpatient Procedure: 2D Echo, Cardiac Doppler and Color Doppler Indications:    122-I22.9 Subsequent ST elevation (STEM) and non-ST elevation                 (NSTEMI) myocardial infarction  History:        Patient has no prior history of Echocardiogram examinations.                 Abnormal ECG, Arrythmias:SVT, Signs/Symptoms:Chest Pain; Risk                 Factors:Hypertension and Dyslipidemia.  Sonographer:    Sheralyn Boatman RDCS Referring Phys: [id number] IMPRESSIONS  1. Left ventricular ejection fraction, by estimation, is 70 to 75%. The left ventricle has hyperdynamic function. The left ventricle has no regional wall motion abnormalities. There is moderate left ventricular hypertrophy. Left ventricular diastolic parameters are indeterminate.  2. Right ventricular systolic function is normal. The right ventricular  size is normal. Tricuspid regurgitation signal is inadequate for assessing PA pressure.  3. The mitral valve is grossly normal. Trivial mitral valve regurgitation.  4. The aortic valve is tricuspid. Aortic valve regurgitation is not visualized.  5. The inferior vena cava is normal in size with greater than 50% respiratory variability, suggesting right atrial pressure of 3 mmHg. FINDINGS  Left Ventricle: Left ventricular ejection fraction, by estimation, is 70 to 75%. The left ventricle has hyperdynamic function. The left ventricle has no regional wall motion abnormalities. The left ventricular internal cavity size was normal in size. There is moderate left ventricular hypertrophy. Left ventricular diastolic parameters are indeterminate. Right Ventricle: The right ventricular size is normal. No increase in right ventricular wall thickness. Right ventricular systolic function is normal. Tricuspid regurgitation signal is inadequate for assessing PA pressure. Left Atrium: Left atrial size was normal in size. Right Atrium: Right atrial size was normal in size. Pericardium: There is no evidence of pericardial effusion. Presence of pericardial fat pad. Mitral Valve: The mitral valve is grossly normal. Trivial mitral valve regurgitation. Tricuspid Valve: The tricuspid valve is grossly normal. Tricuspid valve regurgitation is trivial. Aortic Valve: The aortic valve is tricuspid. Aortic valve regurgitation is not visualized. Mild aortic valve annular calcification. Pulmonic Valve: The pulmonic valve was grossly normal. Pulmonic valve regurgitation is trivial. Aorta: The aortic root is normal in size and structure. Venous: The inferior vena cava is normal in size with greater than 50% respiratory variability, suggesting right atrial pressure of 3 mmHg. IAS/Shunts: No atrial level shunt detected by color flow Doppler.  LEFT VENTRICLE PLAX 2D LVIDd:         3.90 cm     Diastology LVIDs:         2.20 cm     LV e' lateral:   6.31  cm/s LV PW:         1.10 cm     LV E/e' lateral: 10.5 LV IVS:        1.40 cm     LV e'  medial:    4.38 cm/s LVOT diam:     1.90 cm     LV E/e' medial:  15.2 LV SV:         51 LV SV Index:   26 LVOT Area:     2.84 cm  LV Volumes (MOD) LV vol d, MOD A2C: 36.0 ml LV vol d, MOD A4C: 39.0 ml LV vol s, MOD A2C: 9.2 ml LV vol s, MOD A4C: 12.7 ml LV SV MOD A2C:     26.8 ml LV SV MOD A4C:     39.0 ml LV SV MOD BP:      27.2 ml RIGHT VENTRICLE             IVC RV S prime:     11.30 cm/s  IVC diam: 1.30 cm TAPSE (M-mode): 2.1 cm LEFT ATRIUM             Index      RIGHT ATRIUM           Index LA diam:        2.50 cm 1.27 cm/m RA Area:     12.10 cm LA Vol (A2C):   10.3 ml 5.23 ml/m RA Volume:   25.10 ml  12.75 ml/m LA Vol (A4C):   18.0 ml 9.14 ml/m LA Biplane Vol: 14.1 ml 7.16 ml/m  AORTIC VALVE LVOT Vmax:   93.50 cm/s LVOT Vmean:  56.900 cm/s LVOT VTI:    0.179 m  AORTA Ao Root diam: 3.00 cm Ao Asc diam:  3.00 cm MITRAL VALVE MV Area (PHT): 4.06 cm    SHUNTS MV Decel Time: 187 msec    Systemic VTI:  0.18 m MV E velocity: 66.50 cm/s  Systemic Diam: 1.90 cm MV A velocity: 72.30 cm/s MV E/A ratio:  0.92 Nona Dell MD Electronically signed by Nona Dell MD Signature Date/Time: 06/09/2020/3:32:12 PM    Final     Cardiac Studies   ECHO:    1. Left ventricular ejection fraction, by estimation, is 70 to 75%. The  left ventricle has hyperdynamic function. The left ventricle has no  regional wall motion abnormalities. There is moderate left ventricular  hypertrophy. Left ventricular diastolic  parameters are indeterminate.  2. Right ventricular systolic function is normal. The right ventricular  size is normal. Tricuspid regurgitation signal is inadequate for assessing  PA pressure.  3. The mitral valve is grossly normal. Trivial mitral valve  regurgitation.  4. The aortic valve is tricuspid. Aortic valve regurgitation is not  visualized.  5. The inferior vena cava is normal in size with greater than  50%  respiratory variability, suggesting right atrial pressure of 3 mmHg.   Patient Profile     64 y.o. female with HTN, HLD, and pSVT who presented  with chest pain.   Assessment & Plan    NSTEMI:   On ASA, beta blocker, heparin.  Cath tomorrow .  She is on the board.  I answered all of her questions today and we again reviewed the risk benefits of the cath.    LVH:  Moderate.  Will follow as an out patient and consider further work up.  No evidence clinically of diastolic dysfunction.   HTN:  BP controlled.     For questions or updates, please contact [facility] Please consult [email or web address] for contact info under Cardiology/STEMI.   Signed, Rollene Rotunda, MD  06/10/2020, 8:34 AM

## 2020-06-10 NOTE — Progress Notes (Signed)
ANTICOAGULATION CONSULT NOTE  Pharmacy Consult for Heparin Indication: chest pain/ACS  No Known Allergies  Patient Measurements: Height: 5\' 5"  (165.1 cm) Weight: 87 kg (191 lb 12.8 oz) IBW/kg (Calculated) : 57 Heparin Dosing Weight: 86kg  Vital Signs: Temp: 98.1 F (36.7 C) (07/05 0733) Temp Source: Oral (07/05 0733) BP: 110/53 (07/05 0733) Pulse Rate: 60 (07/05 0733)  Labs: Recent Labs    06/07/20 1517 06/07/20 1517 06/07/20 1929 06/08/20 0224 06/08/20 0224 06/08/20 0238 06/08/20 1117 06/08/20 2134 06/09/20 1056 06/10/20 0858 06/10/20 0904  HGB 12.8   < >  --  12.0   < >  --   --   --  12.6 13.0  --   HCT 40.7   < >  --  38.2  --   --   --   --  39.9 40.2  --   PLT 199   < >  --  184  --   --   --   --  204 193  --   HEPARINUNFRC  --   --   --  0.91*  --   --    < > 0.65 0.69 0.50  --   CREATININE 1.08*  --   --   --   --   --   --   --  0.88 0.93  --   TROPONINIHS 35*   < > 678*  --   --  832*  --   --   --   --  64*   < > = values in this interval not displayed.    Estimated Creatinine Clearance: 67.4 mL/min (by C-G formula based on SCr of 0.93 mg/dL).   Medical History: Past Medical History:  Diagnosis Date  . Arrhythmia   . Hyperlipidemia   . Hypertension      Assessment: 63 yoF on heparin for NSTEMI. Baseline CBC wnl, no AC PTA.  Heparin level continues to be at goal 0.5, on 700 units/hr. Hgb 13, plt 193. No s/sx of bleeding or infusion issues.  Goal of Therapy:  Heparin level 0.3-0.7 units/ml Monitor platelets by anticoagulation protocol: Yes   Plan:  Continue heparin infusion at 700 units/hr  Cath in am   08/11/20 PharmD., BCPS Clinical Pharmacist 06/10/2020 11:03 AM   Please check AMION for all [facility] Pharmacy phone numbers After 10:00 PM, call Main Pharmacy [phone]

## 2020-06-10 NOTE — Plan of Care (Signed)
  Problem: Education: Goal: Individualized Educational Video(s) Outcome: Progressing   Problem: Cardiac: Goal: Ability to achieve and maintain adequate cardiovascular perfusion will improve Outcome: Progressing   Problem: Health Behavior/Discharge Planning: Goal: Ability to safely manage health-related needs after discharge will improve Outcome: Progressing   Problem: Education: Goal: Individualized Educational Video(s) Outcome: Progressing   Problem: Activity: Goal: Ability to return to baseline activity level will improve Outcome: Progressing   Problem: Cardiovascular: Goal: Ability to achieve and maintain adequate cardiovascular perfusion will improve Outcome: Progressing Goal: Vascular access site(s) Level 0-1 will be maintained Outcome: Progressing   Problem: Health Behavior/Discharge Planning: Goal: Ability to safely manage health-related needs after discharge will improve Outcome: Progressing   

## 2020-06-10 NOTE — Plan of Care (Signed)
  Problem: Education: Goal: Individualized Educational Video(s) Outcome: Progressing   Problem: Cardiac: Goal: Ability to achieve and maintain adequate cardiovascular perfusion will improve Outcome: Progressing   Problem: Health Behavior/Discharge Planning: Goal: Ability to safely manage health-related needs after discharge will improve Outcome: Progressing   Problem: Education: Goal: Individualized Educational Video(s) Outcome: Progressing   Problem: Activity: Goal: Ability to return to baseline activity level will improve Outcome: Progressing   Problem: Cardiovascular: Goal: Ability to achieve and maintain adequate cardiovascular perfusion will improve Outcome: Progressing Goal: Vascular access site(s) Level 0-1 will be maintained Outcome: Progressing   Problem: Health Behavior/Discharge Planning: Goal: Ability to safely manage health-related needs after discharge will improve Outcome: Progressing

## 2020-06-11 ENCOUNTER — Encounter (HOSPITAL_COMMUNITY): Payer: Self-pay | Admitting: Cardiology

## 2020-06-11 ENCOUNTER — Inpatient Hospital Stay (HOSPITAL_COMMUNITY)
Admission: EM | Disposition: A | Discharge status: Home / Self Care | Payer: Self-pay | Source: Home / Self Care | Attending: Internal Medicine

## 2020-06-11 DIAGNOSIS — N3000 Acute cystitis without hematuria: Secondary | ICD-10-CM

## 2020-06-11 DIAGNOSIS — I214 Non-ST elevation (NSTEMI) myocardial infarction: Secondary | ICD-10-CM

## 2020-06-11 DIAGNOSIS — I10 Essential (primary) hypertension: Secondary | ICD-10-CM

## 2020-06-11 DIAGNOSIS — I471 Supraventricular tachycardia: Secondary | ICD-10-CM

## 2020-06-11 DIAGNOSIS — I2510 Atherosclerotic heart disease of native coronary artery without angina pectoris: Secondary | ICD-10-CM

## 2020-06-11 HISTORY — PX: LEFT HEART CATH AND CORONARY ANGIOGRAPHY: CATH118249

## 2020-06-11 HISTORY — PX: CORONARY STENT INTERVENTION: CATH118234

## 2020-06-11 LAB — BASIC METABOLIC PANEL
Anion gap: 12 (ref 5–15)
BUN: 20 mg/dL (ref 8–23)
CO2: 24 mmol/L (ref 22–32)
Calcium: 9.6 mg/dL (ref 8.9–10.3)
Chloride: 99 mmol/L (ref 98–111)
Creatinine, Ser: 0.95 mg/dL (ref 0.44–1.00)
GFR calc Af Amer: >60 mL/min (ref >60)
GFR calc non Af Amer: >60 mL/min (ref >60)
Glucose, Bld: 107 mg/dL — ABNORMAL HIGH (ref 70–99)
Potassium: 4.2 mmol/L (ref 3.5–5.1)
Sodium: 135 mmol/L (ref 135–145)

## 2020-06-11 LAB — URINALYSIS, ROUTINE W REFLEX MICROSCOPIC
Leukocytes,Ua: NEGATIVE
Nitrite: NEGATIVE

## 2020-06-11 LAB — CBC
HCT: 40.4 % (ref 36.0–46.0)
Hemoglobin: 12.9 g/dL (ref 12.0–15.0)
MCH: 27.8 pg (ref 26.0–34.0)
MCHC: 31.9 g/dL (ref 30.0–36.0)
MCV: 87.1 fL (ref 80.0–100.0)
Platelets: 201 10*3/uL (ref 150–400)
RBC: 4.64 MIL/uL (ref 3.87–5.11)
RDW: 13.5 % (ref 11.5–15.5)
WBC: 5.4 10*3/uL (ref 4.0–10.5)
nRBC: 0 % (ref 0.0–0.2)

## 2020-06-11 LAB — HEPARIN LEVEL (UNFRACTIONATED): Heparin Unfractionated: 0.44 IU/mL (ref 0.30–0.70)

## 2020-06-11 LAB — URINALYSIS, MICROSCOPIC (REFLEX)
RBC / HPF: >50 RBC/hpf (ref 0–5)
Squamous Epithelial / HPF: NONE SEEN (ref 0–5)

## 2020-06-11 LAB — POCT ACTIVATED CLOTTING TIME
Activated Clotting Time: 323 seconds
Activated Clotting Time: 219 seconds
Activated Clotting Time: 180 seconds

## 2020-06-11 SURGERY — LEFT HEART CATH AND CORONARY ANGIOGRAPHY
Anesthesia: LOCAL

## 2020-06-11 MED ORDER — ACETAMINOPHEN 325 MG PO TABS
650.0000 mg | ORAL_TABLET | ORAL | Status: DC | PRN
  Administered 2020-06-11: 650 mg via ORAL
  Filled 2020-06-11: qty 2

## 2020-06-11 MED ORDER — HEPARIN (PORCINE) IN NACL 1000-0.9 UT/500ML-% IV SOLN
INTRAVENOUS | Status: DC | PRN
  Administered 2020-06-11 (×2): 500 mL

## 2020-06-11 MED ORDER — FENTANYL CITRATE (PF) 100 MCG/2ML IJ SOLN: 50.0000 ug | Freq: Once | INTRAMUSCULAR | Status: DC | PRN

## 2020-06-11 MED ORDER — HEPARIN (PORCINE) IN NACL 1000-0.9 UT/500ML-% IV SOLN
INTRAVENOUS | Status: AC
  Filled 2020-06-11: qty 500

## 2020-06-11 MED ORDER — LIDOCAINE HCL (PF) 1 % IJ SOLN
INTRAMUSCULAR | Status: DC | PRN
  Administered 2020-06-11: 2 mL
  Administered 2020-06-11: 20 mL

## 2020-06-11 MED ORDER — VERAPAMIL HCL 2.5 MG/ML IV SOLN: INTRAVENOUS | Status: DC | PRN

## 2020-06-11 MED ORDER — MIDAZOLAM HCL 2 MG/2ML IJ SOLN
INTRAMUSCULAR | Status: AC
  Filled 2020-06-11: qty 2

## 2020-06-11 MED ORDER — IOHEXOL 350 MG/ML SOLN
INTRAVENOUS | Status: DC | PRN
  Administered 2020-06-11: 115 mL

## 2020-06-11 MED ORDER — TICAGRELOR 90 MG PO TABS
ORAL_TABLET | ORAL | Status: DC | PRN
  Administered 2020-06-11: 180 mg via ORAL

## 2020-06-11 MED ORDER — LIDOCAINE HCL (PF) 1 % IJ SOLN
INTRAMUSCULAR | Status: AC
  Filled 2020-06-11: qty 30

## 2020-06-11 MED ORDER — SODIUM CHLORIDE 0.9 % IV SOLN: 250.0000 mL | INTRAVENOUS | Status: DC | PRN

## 2020-06-11 MED ORDER — TICAGRELOR 90 MG PO TABS
ORAL_TABLET | ORAL | Status: AC
  Filled 2020-06-11: qty 2

## 2020-06-11 MED ORDER — SODIUM CHLORIDE 0.9 % IV SOLN: INTRAVENOUS | Status: AC

## 2020-06-11 MED ORDER — HEPARIN SODIUM (PORCINE) 1000 UNIT/ML IJ SOLN
INTRAMUSCULAR | Status: DC | PRN
  Administered 2020-06-11: 4500 [IU] via INTRAVENOUS
  Administered 2020-06-11: 5500 [IU] via INTRAVENOUS

## 2020-06-11 MED ORDER — TIROFIBAN HCL IN NACL 5-0.9 MG/100ML-% IV SOLN
INTRAVENOUS | Status: AC
  Filled 2020-06-11: qty 100

## 2020-06-11 MED ORDER — MIDAZOLAM HCL 2 MG/2ML IJ SOLN
INTRAMUSCULAR | Status: DC | PRN
  Administered 2020-06-11 (×2): 1 mg via INTRAVENOUS
  Administered 2020-06-11: 2 mg via INTRAVENOUS

## 2020-06-11 MED ORDER — TIROFIBAN (AGGRASTAT) BOLUS VIA INFUSION
INTRAVENOUS | Status: DC | PRN
  Administered 2020-06-11: 2175 ug via INTRAVENOUS

## 2020-06-11 MED ORDER — ONDANSETRON HCL 4 MG/2ML IJ SOLN
4.0000 mg | Freq: Four times a day (QID) | INTRAMUSCULAR | Status: DC | PRN
  Administered 2020-06-11 (×2): 4 mg via INTRAVENOUS
  Filled 2020-06-11 (×2): qty 2

## 2020-06-11 MED ORDER — FENTANYL CITRATE (PF) 100 MCG/2ML IJ SOLN
INTRAMUSCULAR | Status: DC | PRN
  Administered 2020-06-11 (×4): 25 ug via INTRAVENOUS

## 2020-06-11 MED ORDER — LABETALOL HCL 5 MG/ML IV SOLN: 10.0000 mg | INTRAVENOUS | Status: AC | PRN

## 2020-06-11 MED ORDER — SODIUM CHLORIDE 0.9% FLUSH: 3.0000 mL | Freq: Two times a day (BID) | INTRAVENOUS | Status: DC

## 2020-06-11 MED ORDER — FENTANYL CITRATE (PF) 100 MCG/2ML IJ SOLN
INTRAMUSCULAR | Status: AC
  Filled 2020-06-11: qty 2

## 2020-06-11 MED ORDER — TICAGRELOR 90 MG PO TABS
90.0000 mg | ORAL_TABLET | Freq: Two times a day (BID) | ORAL | Status: DC
  Administered 2020-06-11 – 2020-06-12 (×2): 90 mg via ORAL
  Filled 2020-06-11 (×2): qty 1

## 2020-06-11 MED ORDER — HYDRALAZINE HCL 20 MG/ML IJ SOLN: 10.0000 mg | INTRAMUSCULAR | Status: AC | PRN

## 2020-06-11 MED ORDER — NITROGLYCERIN 1 MG/10 ML FOR IR/CATH LAB
INTRA_ARTERIAL | Status: DC | PRN
  Administered 2020-06-11: 200 ug via INTRACORONARY
  Administered 2020-06-11: 200 ug via INTRA_ARTERIAL
  Administered 2020-06-11: 200 ug via INTRACORONARY

## 2020-06-11 MED ORDER — TIROFIBAN HCL IN NACL 5-0.9 MG/100ML-% IV SOLN: INTRAVENOUS | Status: DC | PRN

## 2020-06-11 MED ORDER — NITROGLYCERIN 1 MG/10 ML FOR IR/CATH LAB
INTRA_ARTERIAL | Status: AC
  Filled 2020-06-11: qty 10

## 2020-06-11 MED ORDER — HEPARIN SODIUM (PORCINE) 1000 UNIT/ML IJ SOLN
INTRAMUSCULAR | Status: AC
  Filled 2020-06-11: qty 1

## 2020-06-11 MED ORDER — SODIUM CHLORIDE 0.9% FLUSH: 3.0000 mL | INTRAVENOUS | Status: DC | PRN

## 2020-06-11 MED ORDER — VERAPAMIL HCL 2.5 MG/ML IV SOLN
INTRAVENOUS | Status: AC
  Filled 2020-06-11: qty 2

## 2020-06-11 SURGICAL SUPPLY — 22 items
BALLN EUPHORA RX 2.0X12 (BALLOONS) ×2
BALLN ~~LOC~~ EUPHORA RX 3.0X8 (BALLOONS) ×2
BALLOON EUPHORA RX 2.0X12 (BALLOONS) IMPLANT
BALLOON ~~LOC~~ EUPHORA RX 3.0X8 (BALLOONS) IMPLANT
CATH INFINITI 5FR ANG PIGTAIL (CATHETERS) ×1 IMPLANT
CATH OPTITORQUE TIG 4.0 5F (CATHETERS) ×1 IMPLANT
CATH VISTA GUIDE 6FR XBLAD3.5 (CATHETERS) ×1 IMPLANT
DEVICE RAD TR BAND REGULAR (VASCULAR PRODUCTS) ×1 IMPLANT
GLIDESHEATH SLEND SS 6F .021 (SHEATH) ×1 IMPLANT
GUIDEWIRE INQWIRE 1.5J.035X260 (WIRE) IMPLANT
INQWIRE 1.5J .035X260CM (WIRE) ×2
KIT ENCORE 26 ADVANTAGE (KITS) ×1 IMPLANT
KIT HEART LEFT (KITS) ×2 IMPLANT
PACK CARDIAC CATHETERIZATION (CUSTOM PROCEDURE TRAY) ×2 IMPLANT
SHEATH PINNACLE 6F 10CM (SHEATH) ×1 IMPLANT
SHEATH PROBE COVER 6X72 (BAG) ×1 IMPLANT
STENT RESOLUTE ONYX 2.75X12 (Permanent Stent) ×1 IMPLANT
SYR MEDRAD MARK 7 150ML (SYRINGE) ×2 IMPLANT
TRANSDUCER W/STOPCOCK (MISCELLANEOUS) ×2 IMPLANT
TUBING CIL FLEX 10 FLL-RA (TUBING) ×2 IMPLANT
WIRE ASAHI PROWATER 180CM (WIRE) ×1 IMPLANT
WIRE EMERALD 3MM-J .035X150CM (WIRE) ×1 IMPLANT

## 2020-06-11 NOTE — Plan of Care (Signed)
°  Problem: Education: Goal: Individualized Educational Video(s) Outcome: Progressing   Problem: Cardiac: Goal: Ability to achieve and maintain adequate cardiovascular perfusion will improve Outcome: Progressing   Problem: Health Behavior/Discharge Planning: Goal: Ability to safely manage health-related needs after discharge will improve Outcome: Progressing   Problem: Education: Goal: Individualized Educational Video(s) Outcome: Progressing   Problem: Activity: Goal: Ability to return to baseline activity level will improve Outcome: Progressing   Problem: Cardiovascular: Goal: Ability to achieve and maintain adequate cardiovascular perfusion will improve Outcome: Progressing Goal: Vascular access site(s) Level 0-1 will be maintained Outcome: Progressing   Problem: Health Behavior/Discharge Planning: Goal: Ability to safely manage health-related needs after discharge will improve Outcome: Progressing   Problem: Clinical Measurements: Goal: Ability to maintain clinical measurements within normal limits will improve Outcome: Progressing Goal: Will remain free from infection Outcome: Progressing Goal: Diagnostic test results will improve Outcome: Progressing Goal: Respiratory complications will improve Outcome: Progressing Goal: Cardiovascular complication will be avoided Outcome: Progressing   Problem: Activity: Goal: Risk for activity intolerance will decrease Outcome: Progressing   Problem: Nutrition: Goal: Adequate nutrition will be maintained Outcome: Progressing   Problem: Coping: Goal: Level of anxiety will decrease Outcome: Progressing   Problem: Elimination: Goal: Will not experience complications related to bowel motility Outcome: Progressing Goal: Will not experience complications related to urinary retention Outcome: Progressing   Problem: Pain Managment: Goal: General experience of comfort will improve Outcome: Progressing   Problem:  Safety: Goal: Ability to remain free from injury will improve Outcome: Progressing   Problem: Skin Integrity: Goal: Risk for impaired skin integrity will decrease Outcome: Progressing   Problem: Education: Goal: Ability to demonstrate management of disease process will improve Outcome: Progressing Goal: Ability to verbalize understanding of medication therapies will improve Outcome: Progressing Goal: Individualized Educational Video(s) Outcome: Progressing   Problem: Activity: Goal: Capacity to carry out activities will improve Outcome: Progressing   Problem: Cardiac: Goal: Ability to achieve and maintain adequate cardiopulmonary perfusion will improve Outcome: Progressing   

## 2020-06-11 NOTE — Plan of Care (Signed)
  Problem: Education: Goal: Individualized Educational Video(s) Outcome: Progressing   Problem: Cardiac: Goal: Ability to achieve and maintain adequate cardiovascular perfusion will improve Outcome: Progressing   Problem: Health Behavior/Discharge Planning: Goal: Ability to safely manage health-related needs after discharge will improve Outcome: Progressing   Problem: Education: Goal: Individualized Educational Video(s) Outcome: Progressing   Problem: Activity: Goal: Ability to return to baseline activity level will improve Outcome: Progressing   Problem: Cardiovascular: Goal: Ability to achieve and maintain adequate cardiovascular perfusion will improve Outcome: Progressing Goal: Vascular access site(s) Level 0-1 will be maintained Outcome: Progressing   Problem: Health Behavior/Discharge Planning: Goal: Ability to safely manage health-related needs after discharge will improve Outcome: Progressing   Problem: Clinical Measurements: Goal: Ability to maintain clinical measurements within normal limits will improve Outcome: Progressing Goal: Will remain free from infection Outcome: Progressing Goal: Diagnostic test results will improve Outcome: Progressing Goal: Respiratory complications will improve Outcome: Progressing Goal: Cardiovascular complication will be avoided Outcome: Progressing   Problem: Activity: Goal: Risk for activity intolerance will decrease Outcome: Progressing   Problem: Nutrition: Goal: Adequate nutrition will be maintained Outcome: Progressing   Problem: Coping: Goal: Level of anxiety will decrease Outcome: Progressing   Problem: Elimination: Goal: Will not experience complications related to bowel motility Outcome: Progressing Goal: Will not experience complications related to urinary retention Outcome: Progressing   Problem: Pain Managment: Goal: General experience of comfort will improve Outcome: Progressing   Problem:  Safety: Goal: Ability to remain free from injury will improve Outcome: Progressing   Problem: Skin Integrity: Goal: Risk for impaired skin integrity will decrease Outcome: Progressing   Problem: Education: Goal: Ability to demonstrate management of disease process will improve Outcome: Progressing Goal: Ability to verbalize understanding of medication therapies will improve Outcome: Progressing Goal: Individualized Educational Video(s) Outcome: Progressing   Problem: Activity: Goal: Capacity to carry out activities will improve Outcome: Progressing   Problem: Cardiac: Goal: Ability to achieve and maintain adequate cardiopulmonary perfusion will improve Outcome: Progressing

## 2020-06-11 NOTE — Progress Notes (Signed)
Site area- right  Site Prior to Removal- 0   Pressure Applied For-  45 MInutes   Bedrest Beginning at - 15:00   Manual- Yes   Patient Status During Pull- Stable    Post Pull Groin Site- 1   Post Pull Instructions Given- Yes   Post Pull Pulses Present- Yes    Dressing Applied- Tegaderm and Gauze Dressing    Comments:  Hematoma noted to the area that formed during sheath pull. Patient stable, hematoma expressed and marked. Patient's nurse and MD notified of site status post pull.

## 2020-06-11 NOTE — Progress Notes (Signed)
Pt urine is dark red. Appears to be mostly if not all blood, as you cannot see through it at all. Previous RN states that MD is aware and a U/A was performed. Will continue to monitor.

## 2020-06-11 NOTE — Interval H&P Note (Signed)
History and Physical Interval Note:  06/11/2020 8:59 AM  Veronica Bowen  has presented today for surgery, with the diagnosis of NSTEMI.  The various methods of treatment have been discussed with the patient and family. After consideration of risks, benefits and other options for treatment, the patient has consented to  Procedure(s): LEFT HEART CATH AND CORONARY ANGIOGRAPHY (N/A) as a surgical intervention.  The patient's history has been reviewed, patient examined, no change in status, stable for surgery.  I have reviewed the patient's chart and labs.  Questions were answered to the patient's satisfaction.    Cath Lab Visit (complete for each Cath Lab visit)  Clinical Evaluation Leading to the Procedure:   ACS: Yes.    Non-ACS:    Anginal Classification: CCS III  Anti-ischemic medical therapy: Minimal Therapy (1 class of medications)  Non-Invasive Test Results: No non-invasive testing performed  Prior CABG: No previous CABG   Bryan Lemma

## 2020-06-11 NOTE — Progress Notes (Signed)
ANTICOAGULATION CONSULT NOTE  Pharmacy Consult for Heparin Indication: chest pain/ACS  No Known Allergies  Patient Measurements: Height: 5\' 5"  (165.1 cm) Weight: 87 kg (191 lb 12.8 oz) IBW/kg (Calculated) : 57 Heparin Dosing Weight: 86kg  Vital Signs: Temp: 98 F (36.7 C) (07/06 0717) Temp Source: Oral (07/06 0717) BP: 108/56 (07/06 0717) Pulse Rate: 70 (07/06 0717)  Labs: Recent Labs    06/09/20 1056 06/09/20 1056 06/10/20 0858 06/10/20 0904 06/11/20 0319  HGB 12.6   < > 13.0  --  12.9  HCT 39.9  --  40.2  --  40.4  PLT 204  --  193  --  201  HEPARINUNFRC 0.69  --  0.50  --  0.44  CREATININE 0.88  --  0.93  --  0.95  TROPONINIHS  --   --   --  64*  --    < > = values in this interval not displayed.    Estimated Creatinine Clearance: 66 mL/min (by C-G formula based on SCr of 0.95 mg/dL).   Medical History: Past Medical History:  Diagnosis Date  . Arrhythmia   . Hyperlipidemia   . Hypertension      Assessment: 63 yoF on heparin for NSTEMI. Baseline CBC wnl, no AC PTA.  Heparin level continues to be at goal 0.44, on 700 units/hr. Hgb 12.9, Pltc 201. No s/sx of bleeding or infusion issues.  Goal of Therapy:  Heparin level 0.3-0.7 units/ml Monitor platelets by anticoagulation protocol: Yes   Plan:  Continue heparin infusion at 700 units/hr  Cath planned for today. F/u plans for anticoagulation after cath.   08/12/20, Reece Leader, BCCP Clinical Pharmacist  06/11/2020 7:34 AM   [facility] pharmacy phone numbers are listed on [email or web address]

## 2020-06-11 NOTE — Progress Notes (Signed)
Progress Note  Patient Name: Veronica Bowen Date of Encounter: 06/11/2020  Primary Cardiologist:   Rollene Rotunda, MD   Subjective   No chest pain.  She is anxious to go home   Inpatient Medications    Scheduled Meds: . aspirin  81 mg Oral Daily  . cefdinir  300 mg Oral Q12H  . hydrochlorothiazide  12.5 mg Oral Daily  . losartan  50 mg Oral Daily  . metoprolol tartrate  25 mg Oral BID  . rosuvastatin  40 mg Oral Daily  . sodium chloride flush  3 mL Intravenous Once  . sodium chloride flush  3 mL Intravenous Q12H   Continuous Infusions: . sodium chloride    . sodium chloride 1 mL/kg/hr (06/11/20 5885)  . heparin 700 Units/hr (06/10/20 0658)   PRN Meds: sodium chloride, melatonin, nitroGLYCERIN, sodium chloride flush   Vital Signs    Vitals:   06/10/20 2137 06/10/20 2345 06/11/20 0420 06/11/20 0717  BP:   (!) 111/52 (!) 108/56  Pulse: 72 60  70  Resp:  20  14  Temp:  97.9 F (36.6 C) 98 F (36.7 C) 98 F (36.7 C)  TempSrc:  Oral Oral Oral  SpO2:    98%  Weight:      Height:        Intake/Output Summary (Last 24 hours) at 06/11/2020 0758 Last data filed at 06/10/2020 1900 Gross per 24 hour  Intake 564 ml  Output --  Net 564 ml   Filed Weights   06/07/20 1514 06/07/20 2200 06/10/20 0344  Weight: 86.2 kg 89.7 kg 87 kg    Telemetry    NSR- Personally Reviewed  ECG    NSR with new T wave inversion in anterior leads. - Personally Reviewed  Physical Exam   GEN: No  acute distress.   Neck: No  JVD Cardiac: RRR, no murmurs, rubs, or gallops.  Respiratory: Clear   to auscultation bilaterally. GI: Soft, nontender, non-distended, normal bowel sounds  MS:  No edema; No deformity. Neuro:   Nonfocal  Psych: Oriented and appropriate    Labs    Chemistry Recent Labs  Lab 06/09/20 1056 06/10/20 0858 06/11/20 0319  NA 139 137 135  K 4.1 4.0 4.2  CL 104 101 99  CO2 26 26 24   GLUCOSE 95 127* 107*  BUN 17 17 20   CREATININE 0.88 0.93 0.95  CALCIUM  9.4 9.6 9.6  GFRNONAA >60 >60 >60  GFRAA >60 >60 >60  ANIONGAP 9 10 12      Hematology Recent Labs  Lab 06/09/20 1056 06/10/20 0858 06/11/20 0319  WBC 4.9 4.3 5.4  RBC 4.53 4.58 4.64  HGB 12.6 13.0 12.9  HCT 39.9 40.2 40.4  MCV 88.1 87.8 87.1  MCH 27.8 28.4 27.8  MCHC 31.6 32.3 31.9  RDW 13.6 13.4 13.5  PLT 204 193 201    Cardiac EnzymesNo results for input(s): TROPONINI in the last 168 hours. No results for input(s): TROPIPOC in the last 168 hours.   BNPNo results for input(s): BNP, PROBNP in the last 168 hours.   DDimer No results for input(s): DDIMER in the last 168 hours.   Radiology    ECHOCARDIOGRAM COMPLETE  Result Date: 06/09/2020    ECHOCARDIOGRAM REPORT   Patient Name:   Veronica Bowen Date of Exam: 06/09/2020 Medical Rec #:  08/10/2020    Height:       65.0 in Accession #:    Cory Roughen   Weight:  197.8 lb Date of Birth:  1956/10/13    BSA:          1.969 m Patient Age:    63 years     BP:           114/82 mmHg Patient Gender: F            HR:           56 bpm. Exam Location:  Inpatient Procedure: 2D Echo, Cardiac Doppler and Color Doppler Indications:    122-I22.9 Subsequent ST elevation (STEM) and non-ST elevation                 (NSTEMI) myocardial infarction  History:        Patient has no prior history of Echocardiogram examinations.                 Abnormal ECG, Arrythmias:SVT, Signs/Symptoms:Chest Pain; Risk                 Factors:Hypertension and Dyslipidemia.  Sonographer:    Sheralyn Boatmanina West RDCS Referring Phys: [id number] JAMES HOCHREIN IMPRESSIONS  1. Left ventricular ejection fraction, by estimation, is 70 to 75%. The left ventricle has hyperdynamic function. The left ventricle has no regional wall motion abnormalities. There is moderate left ventricular hypertrophy. Left ventricular diastolic parameters are indeterminate.  2. Right ventricular systolic function is normal. The right ventricular size is normal. Tricuspid regurgitation signal is inadequate for assessing PA  pressure.  3. The mitral valve is grossly normal. Trivial mitral valve regurgitation.  4. The aortic valve is tricuspid. Aortic valve regurgitation is not visualized.  5. The inferior vena cava is normal in size with greater than 50% respiratory variability, suggesting right atrial pressure of 3 mmHg. FINDINGS  Left Ventricle: Left ventricular ejection fraction, by estimation, is 70 to 75%. The left ventricle has hyperdynamic function. The left ventricle has no regional wall motion abnormalities. The left ventricular internal cavity size was normal in size. There is moderate left ventricular hypertrophy. Left ventricular diastolic parameters are indeterminate. Right Ventricle: The right ventricular size is normal. No increase in right ventricular wall thickness. Right ventricular systolic function is normal. Tricuspid regurgitation signal is inadequate for assessing PA pressure. Left Atrium: Left atrial size was normal in size. Right Atrium: Right atrial size was normal in size. Pericardium: There is no evidence of pericardial effusion. Presence of pericardial fat pad. Mitral Valve: The mitral valve is grossly normal. Trivial mitral valve regurgitation. Tricuspid Valve: The tricuspid valve is grossly normal. Tricuspid valve regurgitation is trivial. Aortic Valve: The aortic valve is tricuspid. Aortic valve regurgitation is not visualized. Mild aortic valve annular calcification. Pulmonic Valve: The pulmonic valve was grossly normal. Pulmonic valve regurgitation is trivial. Aorta: The aortic root is normal in size and structure. Venous: The inferior vena cava is normal in size with greater than 50% respiratory variability, suggesting right atrial pressure of 3 mmHg. IAS/Shunts: No atrial level shunt detected by color flow Doppler.  LEFT VENTRICLE PLAX 2D LVIDd:         3.90 cm     Diastology LVIDs:         2.20 cm     LV e' lateral:   6.31 cm/s LV PW:         1.10 cm     LV E/e' lateral: 10.5 LV IVS:        1.40 cm      LV e' medial:    4.38 cm/s LVOT diam:  1.90 cm     LV E/e' medial:  15.2 LV SV:         51 LV SV Index:   26 LVOT Area:     2.84 cm  LV Volumes (MOD) LV vol d, MOD A2C: 36.0 ml LV vol d, MOD A4C: 39.0 ml LV vol s, MOD A2C: 9.2 ml LV vol s, MOD A4C: 12.7 ml LV SV MOD A2C:     26.8 ml LV SV MOD A4C:     39.0 ml LV SV MOD BP:      27.2 ml RIGHT VENTRICLE             IVC RV S prime:     11.30 cm/s  IVC diam: 1.30 cm TAPSE (M-mode): 2.1 cm LEFT ATRIUM             Index      RIGHT ATRIUM           Index LA diam:        2.50 cm 1.27 cm/m RA Area:     12.10 cm LA Vol (A2C):   10.3 ml 5.23 ml/m RA Volume:   25.10 ml  12.75 ml/m LA Vol (A4C):   18.0 ml 9.14 ml/m LA Biplane Vol: 14.1 ml 7.16 ml/m  AORTIC VALVE LVOT Vmax:   93.50 cm/s LVOT Vmean:  56.900 cm/s LVOT VTI:    0.179 m  AORTA Ao Root diam: 3.00 cm Ao Asc diam:  3.00 cm MITRAL VALVE MV Area (PHT): 4.06 cm    SHUNTS MV Decel Time: 187 msec    Systemic VTI:  0.18 m MV E velocity: 66.50 cm/s  Systemic Diam: 1.90 cm MV A velocity: 72.30 cm/s MV E/A ratio:  0.92 Nona Dell MD Electronically signed by Nona Dell MD Signature Date/Time: 06/09/2020/3:32:12 PM    Final     Cardiac Studies   ECHO:    1. Left ventricular ejection fraction, by estimation, is 70 to 75%. The  left ventricle has hyperdynamic function. The left ventricle has no  regional wall motion abnormalities. There is moderate left ventricular  hypertrophy. Left ventricular diastolic  parameters are indeterminate.  2. Right ventricular systolic function is normal. The right ventricular  size is normal. Tricuspid regurgitation signal is inadequate for assessing  PA pressure.  3. The mitral valve is grossly normal. Trivial mitral valve  regurgitation.  4. The aortic valve is tricuspid. Aortic valve regurgitation is not  visualized.  5. The inferior vena cava is normal in size with greater than 50%  respiratory variability, suggesting right atrial pressure of 3 mmHg.    Patient Profile     64 y.o. female with HTN, HLD, and pSVT who presented  with chest pain and elevated troponins.   Assessment & Plan    1. NSTEMI:   Presented with chest pain which she has not had in the past. Peak troponin 832. New anterior T wave inversion. Normal EF on Echo.  On ASA, beta blocker, heparin.  Cath today.  Questions answered.   2. LVH:  Moderate.  Will follow as an out patient and consider further work up.  No evidence clinically of diastolic dysfunction.   3. HTN:  BP controlled.   4. Prediabetes. A1c 6.1%.   5. UTI enterobacter  On Omnicef. Afebrile.   6. SVT on metoprolol. No arrhythmia here.   For questions or updates, please contact [facility] Please consult [email or web address] for contact info under Cardiology/STEMI.   Signed, Millie Shorb [place], MD  06/11/2020, 7:58 AM

## 2020-06-11 NOTE — H&P (View-Only) (Signed)
Progress Note  Patient Name: Veronica Bowen Date of Encounter: 06/11/2020  Primary Cardiologist:   Rollene Rotunda, MD   Subjective   No chest pain.  She is anxious to go home   Inpatient Medications    Scheduled Meds: . aspirin  81 mg Oral Daily  . cefdinir  300 mg Oral Q12H  . hydrochlorothiazide  12.5 mg Oral Daily  . losartan  50 mg Oral Daily  . metoprolol tartrate  25 mg Oral BID  . rosuvastatin  40 mg Oral Daily  . sodium chloride flush  3 mL Intravenous Once  . sodium chloride flush  3 mL Intravenous Q12H   Continuous Infusions: . sodium chloride    . sodium chloride 1 mL/kg/hr (06/11/20 5885)  . heparin 700 Units/hr (06/10/20 0658)   PRN Meds: sodium chloride, melatonin, nitroGLYCERIN, sodium chloride flush   Vital Signs    Vitals:   06/10/20 2137 06/10/20 2345 06/11/20 0420 06/11/20 0717  BP:   (!) 111/52 (!) 108/56  Pulse: 72 60  70  Resp:  20  14  Temp:  97.9 F (36.6 C) 98 F (36.7 C) 98 F (36.7 C)  TempSrc:  Oral Oral Oral  SpO2:    98%  Weight:      Height:        Intake/Output Summary (Last 24 hours) at 06/11/2020 0758 Last data filed at 06/10/2020 1900 Gross per 24 hour  Intake 564 ml  Output --  Net 564 ml   Filed Weights   06/07/20 1514 06/07/20 2200 06/10/20 0344  Weight: 86.2 kg 89.7 kg 87 kg    Telemetry    NSR- Personally Reviewed  ECG    NSR with new T wave inversion in anterior leads. - Personally Reviewed  Physical Exam   GEN: No  acute distress.   Neck: No  JVD Cardiac: RRR, no murmurs, rubs, or gallops.  Respiratory: Clear   to auscultation bilaterally. GI: Soft, nontender, non-distended, normal bowel sounds  MS:  No edema; No deformity. Neuro:   Nonfocal  Psych: Oriented and appropriate    Labs    Chemistry Recent Labs  Lab 06/09/20 1056 06/10/20 0858 06/11/20 0319  NA 139 137 135  K 4.1 4.0 4.2  CL 104 101 99  CO2 26 26 24   GLUCOSE 95 127* 107*  BUN 17 17 20   CREATININE 0.88 0.93 0.95  CALCIUM  9.4 9.6 9.6  GFRNONAA >60 >60 >60  GFRAA >60 >60 >60  ANIONGAP 9 10 12      Hematology Recent Labs  Lab 06/09/20 1056 06/10/20 0858 06/11/20 0319  WBC 4.9 4.3 5.4  RBC 4.53 4.58 4.64  HGB 12.6 13.0 12.9  HCT 39.9 40.2 40.4  MCV 88.1 87.8 87.1  MCH 27.8 28.4 27.8  MCHC 31.6 32.3 31.9  RDW 13.6 13.4 13.5  PLT 204 193 201    Cardiac EnzymesNo results for input(s): TROPONINI in the last 168 hours. No results for input(s): TROPIPOC in the last 168 hours.   BNPNo results for input(s): BNP, PROBNP in the last 168 hours.   DDimer No results for input(s): DDIMER in the last 168 hours.   Radiology    ECHOCARDIOGRAM COMPLETE  Result Date: 06/09/2020    ECHOCARDIOGRAM REPORT   Patient Name:   Veronica Bowen Date of Exam: 06/09/2020 Medical Rec #:  08/10/2020    Height:       65.0 in Accession #:    Cory Roughen   Weight:  197.8 lb Date of Birth:  09/19/1956    BSA:          1.969 m Patient Age:    63 years     BP:           114/82 mmHg Patient Gender: F            HR:           56 bpm. Exam Location:  Inpatient Procedure: 2D Echo, Cardiac Doppler and Color Doppler Indications:    122-I22.9 Subsequent ST elevation (STEM) and non-ST elevation                 (NSTEMI) myocardial infarction  History:        Patient has no prior history of Echocardiogram examinations.                 Abnormal ECG, Arrythmias:SVT, Signs/Symptoms:Chest Pain; Risk                 Factors:Hypertension and Dyslipidemia.  Sonographer:    Tina West RDCS Referring Phys: [id number] JAMES HOCHREIN IMPRESSIONS  1. Left ventricular ejection fraction, by estimation, is 70 to 75%. The left ventricle has hyperdynamic function. The left ventricle has no regional wall motion abnormalities. There is moderate left ventricular hypertrophy. Left ventricular diastolic parameters are indeterminate.  2. Right ventricular systolic function is normal. The right ventricular size is normal. Tricuspid regurgitation signal is inadequate for assessing PA  pressure.  3. The mitral valve is grossly normal. Trivial mitral valve regurgitation.  4. The aortic valve is tricuspid. Aortic valve regurgitation is not visualized.  5. The inferior vena cava is normal in size with greater than 50% respiratory variability, suggesting right atrial pressure of 3 mmHg. FINDINGS  Left Ventricle: Left ventricular ejection fraction, by estimation, is 70 to 75%. The left ventricle has hyperdynamic function. The left ventricle has no regional wall motion abnormalities. The left ventricular internal cavity size was normal in size. There is moderate left ventricular hypertrophy. Left ventricular diastolic parameters are indeterminate. Right Ventricle: The right ventricular size is normal. No increase in right ventricular wall thickness. Right ventricular systolic function is normal. Tricuspid regurgitation signal is inadequate for assessing PA pressure. Left Atrium: Left atrial size was normal in size. Right Atrium: Right atrial size was normal in size. Pericardium: There is no evidence of pericardial effusion. Presence of pericardial fat pad. Mitral Valve: The mitral valve is grossly normal. Trivial mitral valve regurgitation. Tricuspid Valve: The tricuspid valve is grossly normal. Tricuspid valve regurgitation is trivial. Aortic Valve: The aortic valve is tricuspid. Aortic valve regurgitation is not visualized. Mild aortic valve annular calcification. Pulmonic Valve: The pulmonic valve was grossly normal. Pulmonic valve regurgitation is trivial. Aorta: The aortic root is normal in size and structure. Venous: The inferior vena cava is normal in size with greater than 50% respiratory variability, suggesting right atrial pressure of 3 mmHg. IAS/Shunts: No atrial level shunt detected by color flow Doppler.  LEFT VENTRICLE PLAX 2D LVIDd:         3.90 cm     Diastology LVIDs:         2.20 cm     LV e' lateral:   6.31 cm/s LV PW:         1.10 cm     LV E/e' lateral: 10.5 LV IVS:        1.40 cm      LV e' medial:    4.38 cm/s LVOT diam:       1.90 cm     LV E/e' medial:  15.2 LV SV:         51 LV SV Index:   26 LVOT Area:     2.84 cm  LV Volumes (MOD) LV vol d, MOD A2C: 36.0 ml LV vol d, MOD A4C: 39.0 ml LV vol s, MOD A2C: 9.2 ml LV vol s, MOD A4C: 12.7 ml LV SV MOD A2C:     26.8 ml LV SV MOD A4C:     39.0 ml LV SV MOD BP:      27.2 ml RIGHT VENTRICLE             IVC RV S prime:     11.30 cm/s  IVC diam: 1.30 cm TAPSE (M-mode): 2.1 cm LEFT ATRIUM             Index      RIGHT ATRIUM           Index LA diam:        2.50 cm 1.27 cm/m RA Area:     12.10 cm LA Vol (A2C):   10.3 ml 5.23 ml/m RA Volume:   25.10 ml  12.75 ml/m LA Vol (A4C):   18.0 ml 9.14 ml/m LA Biplane Vol: 14.1 ml 7.16 ml/m  AORTIC VALVE LVOT Vmax:   93.50 cm/s LVOT Vmean:  56.900 cm/s LVOT VTI:    0.179 m  AORTA Ao Root diam: 3.00 cm Ao Asc diam:  3.00 cm MITRAL VALVE MV Area (PHT): 4.06 cm    SHUNTS MV Decel Time: 187 msec    Systemic VTI:  0.18 m MV E velocity: 66.50 cm/s  Systemic Diam: 1.90 cm MV A velocity: 72.30 cm/s MV E/A ratio:  0.92 Nona Dell MD Electronically signed by Nona Dell MD Signature Date/Time: 06/09/2020/3:32:12 PM    Final     Cardiac Studies   ECHO:    1. Left ventricular ejection fraction, by estimation, is 70 to 75%. The  left ventricle has hyperdynamic function. The left ventricle has no  regional wall motion abnormalities. There is moderate left ventricular  hypertrophy. Left ventricular diastolic  parameters are indeterminate.  2. Right ventricular systolic function is normal. The right ventricular  size is normal. Tricuspid regurgitation signal is inadequate for assessing  PA pressure.  3. The mitral valve is grossly normal. Trivial mitral valve  regurgitation.  4. The aortic valve is tricuspid. Aortic valve regurgitation is not  visualized.  5. The inferior vena cava is normal in size with greater than 50%  respiratory variability, suggesting right atrial pressure of 3 mmHg.    Patient Profile     64 y.o. female with HTN, HLD, and pSVT who presented  with chest pain and elevated troponins.   Assessment & Plan    1. NSTEMI:   Presented with chest pain which she has not had in the past. Peak troponin 832. New anterior T wave inversion. Normal EF on Echo.  On ASA, beta blocker, heparin.  Cath today.  Questions answered.   2. LVH:  Moderate.  Will follow as an out patient and consider further work up.  No evidence clinically of diastolic dysfunction.   3. HTN:  BP controlled.   4. Prediabetes. A1c 6.1%.   5. UTI enterobacter  On Omnicef. Afebrile.   6. SVT on metoprolol. No arrhythmia here.   For questions or updates, please contact [facility] Please consult [email or web address] for contact info under Cardiology/STEMI.   Signed, German Manke [place], MD  06/11/2020, 7:58 AM

## 2020-06-12 ENCOUNTER — Telehealth: Payer: Self-pay | Admitting: Cardiology

## 2020-06-12 DIAGNOSIS — E7800 Pure hypercholesterolemia, unspecified: Secondary | ICD-10-CM

## 2020-06-12 DIAGNOSIS — N3001 Acute cystitis with hematuria: Secondary | ICD-10-CM

## 2020-06-12 MED ORDER — ASPIRIN 81 MG PO CHEW: 81.0000 mg | CHEWABLE_TABLET | Freq: Every day | ORAL | 6 refills | Status: DC

## 2020-06-12 MED ORDER — SULFAMETHOXAZOLE-TRIMETHOPRIM 800-160 MG PO TABS
1.0000 | ORAL_TABLET | Freq: Two times a day (BID) | ORAL | Status: DC
  Administered 2020-06-12: 1 via ORAL
  Filled 2020-06-12: qty 1

## 2020-06-12 MED ORDER — TICAGRELOR 90 MG PO TABS: 90.0000 mg | ORAL_TABLET | Freq: Two times a day (BID) | ORAL | 11 refills | Status: DC

## 2020-06-12 MED ORDER — ROSUVASTATIN CALCIUM 40 MG PO TABS: 40.0000 mg | ORAL_TABLET | Freq: Every day | ORAL | 6 refills | Status: DC

## 2020-06-12 MED ORDER — METOPROLOL TARTRATE 25 MG PO TABS: 25.0000 mg | ORAL_TABLET | Freq: Two times a day (BID) | ORAL | 6 refills | Status: DC

## 2020-06-12 MED ORDER — NITROGLYCERIN 0.4 MG SL SUBL: 0.4000 mg | SUBLINGUAL_TABLET | SUBLINGUAL | 12 refills | Status: DC | PRN

## 2020-06-12 MED ORDER — SULFAMETHOXAZOLE-TRIMETHOPRIM 800-160 MG PO TABS: 1.0000 | ORAL_TABLET | Freq: Two times a day (BID) | ORAL | 0 refills | Status: DC

## 2020-06-12 MED FILL — ASPIRIN LOW DOSE 81 MG CHEW: 81 | 30 days supply | Qty: 30 | Fill #0

## 2020-06-12 MED FILL — METOPROLOL TARTRATE 25 MG T: 25 | 30 days supply | Qty: 60 | Fill #0

## 2020-06-12 MED FILL — NITROGLYCERIN 0.4 MG TAB SL: 0.4 | 8 days supply | Qty: 25 | Fill #0

## 2020-06-12 MED FILL — SULFAMETHOXAZOLE-TMP DS TAB: 800-160 | 3 days supply | Qty: 6 | Fill #0

## 2020-06-12 MED FILL — BRILINTA 90 MG TABLET: 90 | 30 days supply | Qty: 60 | Fill #0

## 2020-06-12 MED FILL — Lidocaine HCl Local Preservative Free (PF) Inj 1%: INTRAMUSCULAR | Qty: 30 | Status: AC

## 2020-06-12 MED FILL — Tirofiban HCl in NaCl 0.9% IV Soln 5 MG/100ML (Base Equiv): INTRAVENOUS | Qty: 100 | Status: AC

## 2020-06-12 NOTE — Progress Notes (Signed)
Progress Note  Patient Name: Veronica Bowen Date of Encounter: 06/12/2020  Primary Cardiologist:   Rollene Rotunda, MD   Subjective   No chest pain.  She is anxious to go home. Did have some hematuria post PCI. Now resolved. Right groin is sore.    Inpatient Medications    Scheduled Meds:  aspirin  81 mg Oral Daily   cefdinir  300 mg Oral Q12H   hydrochlorothiazide  12.5 mg Oral Daily   losartan  50 mg Oral Daily   metoprolol tartrate  25 mg Oral BID   rosuvastatin  40 mg Oral Daily   sodium chloride flush  3 mL Intravenous Once   sodium chloride flush  3 mL Intravenous Q12H   sodium chloride flush  3 mL Intravenous Q12H   ticagrelor  90 mg Oral BID   Continuous Infusions:  sodium chloride     PRN Meds: sodium chloride, acetaminophen, fentaNYL (SUBLIMAZE) injection, melatonin, nitroGLYCERIN, ondansetron (ZOFRAN) IV, sodium chloride flush   Vital Signs    Vitals:   06/11/20 2203 06/11/20 2341 06/12/20 0400 06/12/20 0713  BP: (!) 147/66 (!) 157/84 (!) 122/55 139/67  Pulse: 91   68  Resp:  20 19 19   Temp:  97.6 F (36.4 C) 97.6 F (36.4 C) 97.9 F (36.6 C)  TempSrc:  Oral Oral Oral  SpO2:  99% 100% 100%  Weight:      Height:        Intake/Output Summary (Last 24 hours) at 06/12/2020 0731 Last data filed at 06/11/2020 1800 Gross per 24 hour  Intake 598.33 ml  Output 600 ml  Net -1.67 ml   Filed Weights   06/07/20 1514 06/07/20 2200 06/10/20 0344  Weight: 86.2 kg 89.7 kg 87 kg    Telemetry    NSR- Personally Reviewed  ECG    NSR with T wave inversion in anterior leads. - Personally Reviewed  Physical Exam   GEN: No  acute distress.   Neck: No  JVD Cardiac: RRR, no murmurs, rubs, or gallops.  Respiratory: Clear   to auscultation bilaterally. GI: Soft, nontender, non-distended, normal bowel sounds  MS:  No edema; No deformity. Right groin sore to palpation but soft. No pulsation or bruit.  Neuro:   Nonfocal  Psych: Oriented and appropriate      Labs    Chemistry Recent Labs  Lab 06/09/20 1056 06/10/20 0858 06/11/20 0319  NA 139 137 135  K 4.1 4.0 4.2  CL 104 101 99  CO2 26 26 24   GLUCOSE 95 127* 107*  BUN 17 17 20   CREATININE 0.88 0.93 0.95  CALCIUM 9.4 9.6 9.6  GFRNONAA >60 >60 >60  GFRAA >60 >60 >60  ANIONGAP 9 10 12      Hematology Recent Labs  Lab 06/09/20 1056 06/10/20 0858 06/11/20 0319  WBC 4.9 4.3 5.4  RBC 4.53 4.58 4.64  HGB 12.6 13.0 12.9  HCT 39.9 40.2 40.4  MCV 88.1 87.8 87.1  MCH 27.8 28.4 27.8  MCHC 31.6 32.3 31.9  RDW 13.6 13.4 13.5  PLT 204 193 201    Cardiac EnzymesNo results for input(s): TROPONINI in the last 168 hours. No results for input(s): TROPIPOC in the last 168 hours.   BNPNo results for input(s): BNP, PROBNP in the last 168 hours.   DDimer No results for input(s): DDIMER in the last 168 hours.   Radiology    CARDIAC CATHETERIZATION  Result Date: 06/11/2020  CULPRIT LESION: Mid LAD lesion is 90% stenosed.  A drug-eluting stent was successfully placed using a STENT RESOLUTE ONYX [id number]. Postdilated to 3.1 mm  Post intervention, there is a 0% residual stenosis.  Otherwise angiographically normal coronary arteries  LV end diastolic pressure is severely elevated -24 mmHg  SUMMARY  Severe single-vessel CAD with proximal-mid LAD 90% stenosis just prior to major second diagonal branch which reaches the apex.  Successful DES PCI of mid LAD 90% lesion with Synergy DES 2.75 mm x 12 mm (postdilated to 3.1 mm)  Moderate to severely elevated LVEDP of 24 mmHg -> indicating DIASTOLIC HEART FAILURE Bryan Lemma, MD   Cardiac Studies   ECHO:    1. Left ventricular ejection fraction, by estimation, is 70 to 75%. The  left ventricle has hyperdynamic function. The left ventricle has no  regional wall motion abnormalities. There is moderate left ventricular  hypertrophy. Left ventricular diastolic  parameters are indeterminate.  2. Right ventricular systolic function is  normal. The right ventricular  size is normal. Tricuspid regurgitation signal is inadequate for assessing  PA pressure.  3. The mitral valve is grossly normal. Trivial mitral valve  regurgitation.  4. The aortic valve is tricuspid. Aortic valve regurgitation is not  visualized.  5. The inferior vena cava is normal in size with greater than 50%  respiratory variability, suggesting right atrial pressure of 3 mmHg.   Patient Profile     64 y.o. female with HTN, HLD, and pSVT who presented  with chest pain and elevated troponins.   Assessment & Plan    1. NSTEMI:   Presented with chest pain which she has not had in the past. Peak troponin 832. New anterior T wave inversion. Normal EF on Echo.  Cardiac cath showed severe LAD stenosis that was successfully stented. On ASA and Brilinta for one year. Continue beta blocker, statin. Plan to ambulate this am. Home today.  2. LVH:  Moderate likely related to HTN.  Will follow as an out patient and consider further work up.  No evidence clinically of diastolic dysfunction.   3. HTN:  BP controlled.   4. Prediabetes. A1c 6.1%.   5. UTI enterobacter  On Omnicef. Afebrile. Will transition to oral TMP/Sulfa. Did have hematuria post cath related to UTI and IV blood thinner. Resolved.   6. SVT on metoprolol. No arrhythmia here.   For questions or updates, please contact [facility] Please consult [email or web address] for contact info under Cardiology/STEMI.   Signed, Shanda Cadotte [place], MD  06/12/2020, 7:31 AM

## 2020-06-12 NOTE — Plan of Care (Signed)
  Problem: Education: Goal: Individualized Educational Video(s) Outcome: Completed/Met   Problem: Cardiac: Goal: Ability to achieve and maintain adequate cardiovascular perfusion will improve Outcome: Completed/Met   Problem: Health Behavior/Discharge Planning: Goal: Ability to safely manage health-related needs after discharge will improve Outcome: Completed/Met   Problem: Education: Goal: Individualized Educational Video(s) Outcome: Completed/Met   Problem: Activity: Goal: Ability to return to baseline activity level will improve Outcome: Completed/Met   Problem: Cardiovascular: Goal: Ability to achieve and maintain adequate cardiovascular perfusion will improve Outcome: Completed/Met Goal: Vascular access site(s) Level 0-1 will be maintained Outcome: Completed/Met   Problem: Health Behavior/Discharge Planning: Goal: Ability to safely manage health-related needs after discharge will improve Outcome: Completed/Met   Problem: Clinical Measurements: Goal: Ability to maintain clinical measurements within normal limits will improve Outcome: Completed/Met Goal: Will remain free from infection Outcome: Completed/Met Goal: Diagnostic test results will improve Outcome: Completed/Met Goal: Respiratory complications will improve Outcome: Completed/Met Goal: Cardiovascular complication will be avoided Outcome: Completed/Met   Problem: Activity: Goal: Risk for activity intolerance will decrease Outcome: Completed/Met   Problem: Nutrition: Goal: Adequate nutrition will be maintained Outcome: Completed/Met   Problem: Coping: Goal: Level of anxiety will decrease Outcome: Completed/Met   Problem: Elimination: Goal: Will not experience complications related to bowel motility Outcome: Completed/Met Goal: Will not experience complications related to urinary retention Outcome: Completed/Met   Problem: Pain Managment: Goal: General experience of comfort will improve Outcome:  Completed/Met   Problem: Safety: Goal: Ability to remain free from injury will improve Outcome: Completed/Met   Problem: Skin Integrity: Goal: Risk for impaired skin integrity will decrease Outcome: Completed/Met   Problem: Education: Goal: Ability to demonstrate management of disease process will improve Outcome: Completed/Met Goal: Ability to verbalize understanding of medication therapies will improve Outcome: Completed/Met Goal: Individualized Educational Video(s) Outcome: Completed/Met   Problem: Activity: Goal: Capacity to carry out activities will improve Outcome: Completed/Met   Problem: Cardiac: Goal: Ability to achieve and maintain adequate cardiopulmonary perfusion will improve Outcome: Completed/Met

## 2020-06-12 NOTE — Telephone Encounter (Signed)
New message   Scheduled patient for TOC appt per Vin Bhagat with [facility]ale[facility] Course at 11:15 am on 06/26/20.

## 2020-06-12 NOTE — Telephone Encounter (Signed)
Patient is still currently admitted. 

## 2020-06-12 NOTE — TOC Transition Note (Signed)
Transition of Care [facility]) - CM/SW Discharge Note   Patient Details  Name: Destinie Thornsberry MRN: [id number] Date of Birth: [birth year]  Transition of Care [facility]) CM/SW Contact:  Zenon Mayo, RN Phone Number: 06/12/2020, 10:24 AM   Clinical Narrative:    Patient is for dc today, she will be on brilinta, Staff RN Pamala Hurry gave patient the coupon.  NCM spoke with patient and informed her that TOC will be filling her first 30 days free for her and we are also awaiting benefit check to see how much her refills will be, if she has met her deductible she can use the 5.00 each month card but if not she will not be able to use that until deductible is met and the 5.00 card goes towards the deductible for 250.00.  She states her phone is [phone].  She has no other needs.   Final next level of care: Home/Self Care Barriers to Discharge: No Barriers Identified   Patient Goals and CMS Choice Patient states their goals for this hospitalization and ongoing recovery are:: get better   Choice offered to / list presented to : NA  Discharge Placement                       Discharge Plan and Services                          HH Arranged: NA          Social Determinants of Health (SDOH) Interventions     Readmission Risk Interventions No flowsheet data found.

## 2020-06-12 NOTE — Discharge Summary (Signed)
Discharge Summary    Patient ID: Veronica Bowen MRN: [id number]; DOB: 08-09-1956  Admit date: 06/07/2020 Discharge date: 06/12/2020  Primary Care Provider: Renaye Rakers, MD  Primary Cardiologist: Rollene Rotunda, MD   Discharge Diagnoses    Principal Problem:   NSTEMI (non-ST elevated myocardial infarction) [facility]) Active Problems:   Hypertension   Hyperlipidemia   Urinary tract infection   Chest pain   Supraventricular tachycardia (HCC)   LVH  Diagnostic Studies/Procedures    Echo 06/09/20 1. Left ventricular ejection fraction, by estimation, is 70 to 75%. The  left ventricle has hyperdynamic function. The left ventricle has no  regional wall motion abnormalities. There is moderate left ventricular  hypertrophy. Left ventricular diastolic  parameters are indeterminate.  2. Right ventricular systolic function is normal. The right ventricular  size is normal. Tricuspid regurgitation signal is inadequate for assessing  PA pressure.  3. The mitral valve is grossly normal. Trivial mitral valve  regurgitation.  4. The aortic valve is tricuspid. Aortic valve regurgitation is not  visualized.  5. The inferior vena cava is normal in size with greater than 50%  respiratory variability, suggesting right atrial pressure of 3 mmHg.    CORONARY STENT INTERVENTION 06/11/20  LEFT HEART CATH AND CORONARY ANGIOGRAPHY  Conclusion    CULPRIT LESION: Mid LAD lesion is 90% stenosed.  A drug-eluting stent was successfully placed using a STENT RESOLUTE ONYX [id number]. Postdilated to 3.1 mm  Post intervention, there is a 0% residual stenosis.  Otherwise angiographically normal coronary arteries  LV end diastolic pressure is severely elevated -24 mmHg   SUMMARY  Severe single-vessel CAD with proximal-mid LAD 90% stenosis just prior to major second diagonal branch which reaches the apex. ? Successful DES PCI of mid LAD 90% lesion with Synergy DES 2.75 mm x 12 mm (postdilated to 3.1  mm)  Moderate to severely elevated LVEDP of 24 mmHg -> indicating DIASTOLIC HEART FAILURE Diagnostic Dominance: Right  Intervention     History of Present Illness     Veronica Bowen is a 64 y.o. female with hx of pSVT, HTN and HLD presented for chest pain evaluation and found to have elevated troponin.   She has history of SVT which has caused her to have acute onset shortness of breath and noticeable palpitations in the past. She had prolonged episode of PSVT and was given IV adenosine in the office [facility])  which converted to sinus rhythm.  Presented with acute onset chest pressure. She initially though she may have indigestion and took Tums without any relief. Due to recurrent worsening pain she came to ER for further evaluation. Troponin 35. Started on heparin and admitted.   Hospital Course     Consultants: None  1. NSTEMI - Hs-Troponin peaked at 832. Treated with heparin over long weekend. EKG with new TWI anteriorly. Echo showed preserved LVEF without WM abnormality. Cath showed severe single-vessel CAD with proximal-mid LAD 90% stenosis s/p PCI with DES. DAPT with ASA and Brillinta. No recurrent chest pain. Continue statin and BB.   2. HLD - 06/08/2020: Cholesterol 121; HDL 47; LDL Cholesterol 59; Triglycerides 74; VLDL 15  - Continued home Crestor  3. Hx of PSVT - No reoccurrence - Continued BB  4. UTI enterobacter   - Treated with Omnicef while admitted.  Afebrile. Transitioned to oral TMP/Sulfa to complete course. Did have hematuria post cath related to UTI and IV blood thinner which resolved.  5. HTN - BP stable on BB.   6. LVH -  Moderate likely related to HTN.  Will follow as an outpatient and consider further work up.  No evidence clinically of diastolic dysfunction.   Did the patient have an acute coronary syndrome (MI, NSTEMI, STEMI, etc) this admission?:  Yes                               AHA/ACC Clinical Performance & Quality  Measures: 1. Aspirin prescribed? - Yes 2. ADP Receptor Inhibitor (Plavix/Clopidogrel, Brilinta/Ticagrelor or Effient/Prasugrel) prescribed (includes medically managed patients)? - Yes 3. Beta Blocker prescribed? - Yes 4. High Intensity Statin (Lipitor 40-80mg  or Crestor 20-40mg ) prescribed? - Yes 5. EF assessed during THIS hospitalization? - Yes 6. For EF <40%, was ACEI/ARB prescribed? - Not Applicable (EF >/= 40%) 7. For EF <40%, Aldosterone Antagonist (Spironolactone or Eplerenone) prescribed? - Not Applicable (EF >/= 40%) 8. Cardiac Rehab Phase II ordered (including medically managed patients)? - Yes   _____________  Discharge Vitals Blood pressure 139/67, pulse 68, temperature 97.9 F (36.6 C), temperature source Oral, resp. rate 19, height 5\' 5"  (1.651 m), weight 87 kg, SpO2 100 %.  Filed Weights   06/07/20 1514 06/07/20 2200 06/10/20 0344  Weight: 86.2 kg 89.7 kg 87 kg    Labs & Radiologic Studies    CBC Recent Labs    06/10/20 0858 06/11/20 0319  WBC 4.3 5.4  HGB 13.0 12.9  HCT 40.2 40.4  MCV 87.8 87.1  PLT 193 201   Basic Metabolic Panel Recent Labs    09/81/1906/04/26 0858 06/11/20 0319  NA 137 135  K 4.0 4.2  CL 101 99  CO2 26 24  GLUCOSE 127* 107*  BUN 17 20  CREATININE 0.93 0.95  CALCIUM 9.6 9.6   Liver Function Tests No results for input(s): AST, ALT, ALKPHOS, BILITOT, PROT, ALBUMIN in the last 72 hours. No results for input(s): LIPASE, AMYLASE in the last 72 hours. High Sensitivity Troponin:   Recent Labs  Lab 06/07/20 1517 06/07/20 1929 06/08/20 0238 06/10/20 0904  TROPONINIHS 35* 678* 832* 64*   _____________  DG Chest 2 View  Result Date: 06/07/2020 CLINICAL DATA:  Chest pain. EXAM: CHEST - 2 VIEW COMPARISON:  Chest x-ray dated Apr 18, 2010. FINDINGS: The heart size and mediastinal contours are within normal limits. Normal pulmonary vascularity. Mild left basilar atelectasis. No focal consolidation, pleural effusion, or pneumothorax. No acute  osseous abnormality. IMPRESSION: 1. Mild left basilar atelectasis. Electronically Signed   By: Obie DredgeWilliam T Derry M.D.   On: 06/07/2020 15:56   CARDIAC CATHETERIZATION  Result Date: 06/11/2020  CULPRIT LESION: Mid LAD lesion is 90% stenosed.  A drug-eluting stent was successfully placed using a STENT RESOLUTE ONYX [id number]. Postdilated to 3.1 mm  Post intervention, there is a 0% residual stenosis.  Otherwise angiographically normal coronary arteries  LV end diastolic pressure is severely elevated -24 mmHg  SUMMARY  Severe single-vessel CAD with proximal-mid LAD 90% stenosis just prior to major second diagonal branch which reaches the apex.  Successful DES PCI of mid LAD 90% lesion with Synergy DES 2.75 mm x 12 mm (postdilated to 3.1 mm)  Moderate to severely elevated LVEDP of 24 mmHg -> indicating DIASTOLIC HEART FAILURE Bryan Lemmaavid Harding, MD  ECHOCARDIOGRAM COMPLETE  Result Date: 06/09/2020    ECHOCARDIOGRAM REPORT   Patient Name:   Veronica Bowen Date of Exam: 06/09/2020 Medical Rec #:  [id number]    Height:       65.0 in Accession #:  [id number]   Weight:       197.8 lb Date of Birth:  November 01, 1956    BSA:          1.969 m Patient Age:    63 years     BP:           114/82 mmHg Patient Gender: F            HR:           56 bpm. Exam Location:  Inpatient Procedure: 2D Echo, Cardiac Doppler and Color Doppler Indications:    122-I22.9 Subsequent ST elevation (STEM) and non-ST elevation                 (NSTEMI) myocardial infarction  History:        Patient has no prior history of Echocardiogram examinations.                 Abnormal ECG, Arrythmias:SVT, Signs/Symptoms:Chest Pain; Risk                 Factors:Hypertension and Dyslipidemia.  Sonographer:    Sheralyn Boatman RDCS Referring Phys: [id number] JAMES HOCHREIN IMPRESSIONS  1. Left ventricular ejection fraction, by estimation, is 70 to 75%. The left ventricle has hyperdynamic function. The left ventricle has no regional wall motion abnormalities. There is moderate left  ventricular hypertrophy. Left ventricular diastolic parameters are indeterminate.  2. Right ventricular systolic function is normal. The right ventricular size is normal. Tricuspid regurgitation signal is inadequate for assessing PA pressure.  3. The mitral valve is grossly normal. Trivial mitral valve regurgitation.  4. The aortic valve is tricuspid. Aortic valve regurgitation is not visualized.  5. The inferior vena cava is normal in size with greater than 50% respiratory variability, suggesting right atrial pressure of 3 mmHg. FINDINGS  Left Ventricle: Left ventricular ejection fraction, by estimation, is 70 to 75%. The left ventricle has hyperdynamic function. The left ventricle has no regional wall motion abnormalities. The left ventricular internal cavity size was normal in size. There is moderate left ventricular hypertrophy. Left ventricular diastolic parameters are indeterminate. Right Ventricle: The right ventricular size is normal. No increase in right ventricular wall thickness. Right ventricular systolic function is normal. Tricuspid regurgitation signal is inadequate for assessing PA pressure. Left Atrium: Left atrial size was normal in size. Right Atrium: Right atrial size was normal in size. Pericardium: There is no evidence of pericardial effusion. Presence of pericardial fat pad. Mitral Valve: The mitral valve is grossly normal. Trivial mitral valve regurgitation. Tricuspid Valve: The tricuspid valve is grossly normal. Tricuspid valve regurgitation is trivial. Aortic Valve: The aortic valve is tricuspid. Aortic valve regurgitation is not visualized. Mild aortic valve annular calcification. Pulmonic Valve: The pulmonic valve was grossly normal. Pulmonic valve regurgitation is trivial. Aorta: The aortic root is normal in size and structure. Venous: The inferior vena cava is normal in size with greater than 50% respiratory variability, suggesting right atrial pressure of 3 mmHg. IAS/Shunts: No atrial  level shunt detected by color flow Doppler.  LEFT VENTRICLE PLAX 2D LVIDd:         3.90 cm     Diastology LVIDs:         2.20 cm     LV e' lateral:   6.31 cm/s LV PW:         1.10 cm     LV E/e' lateral: 10.5 LV IVS:        1.40 cm     LV e'  medial:    4.38 cm/s LVOT diam:     1.90 cm     LV E/e' medial:  15.2 LV SV:         51 LV SV Index:   26 LVOT Area:     2.84 cm  LV Volumes (MOD) LV vol d, MOD A2C: 36.0 ml LV vol d, MOD A4C: 39.0 ml LV vol s, MOD A2C: 9.2 ml LV vol s, MOD A4C: 12.7 ml LV SV MOD A2C:     26.8 ml LV SV MOD A4C:     39.0 ml LV SV MOD BP:      27.2 ml RIGHT VENTRICLE             IVC RV S prime:     11.30 cm/s  IVC diam: 1.30 cm TAPSE (M-mode): 2.1 cm LEFT ATRIUM             Index      RIGHT ATRIUM           Index LA diam:        2.50 cm 1.27 cm/m RA Area:     12.10 cm LA Vol (A2C):   10.3 ml 5.23 ml/m RA Volume:   25.10 ml  12.75 ml/m LA Vol (A4C):   18.0 ml 9.14 ml/m LA Biplane Vol: 14.1 ml 7.16 ml/m  AORTIC VALVE LVOT Vmax:   93.50 cm/s LVOT Vmean:  56.900 cm/s LVOT VTI:    0.179 m  AORTA Ao Root diam: 3.00 cm Ao Asc diam:  3.00 cm MITRAL VALVE MV Area (PHT): 4.06 cm    SHUNTS MV Decel Time: 187 msec    Systemic VTI:  0.18 m MV E velocity: 66.50 cm/s  Systemic Diam: 1.90 cm MV A velocity: 72.30 cm/s MV E/A ratio:  0.92 Nona Dell MD Electronically signed by Nona Dell MD Signature Date/Time: 06/09/2020/3:32:12 PM    Final    Disposition   Pt is being discharged home today in good condition.  Follow-up Plans & Appointments     Follow-up Information    Azalee Course, [place]. Go on 06/20/2020.   Specialties: Cardiology, Radiology Why: @11 :15am for hospital follow up with Dr. PA. Please arrive 15 minutes early  Contact information: [address] [place] [phone]              Discharge Instructions    Diet - low sodium heart healthy   Complete by: As directed    Discharge instructions   Complete by: As directed    NO  HEAVY LIFTING (>10lbs) X 2 WEEKS. NO SEXUAL ACTIVITY X 2 WEEKS. NO DRIVING X 1 WEEK. NO SOAKING BATHS, HOT TUBS, POOLS, ETC., X 7 DAYS.   Increase activity slowly   Complete by: As directed       Discharge Medications   Allergies as of 06/12/2020   No Known Allergies     Medication List    STOP taking these medications   atorvastatin 40 MG tablet Commonly known as: LIPITOR   metoprolol succinate 50 MG 24 hr tablet Commonly known as: TOPROL-XL     TAKE these medications   aspirin 81 MG chewable tablet Chew 1 tablet (81 mg total) by mouth daily. Start taking on: June 13, 2020   losartan-hydrochlorothiazide 50-12.5 MG tablet Commonly known as: HYZAAR Take 1 tablet by mouth daily.   metoprolol tartrate 25 MG tablet Commonly known as: LOPRESSOR Take 1 tablet (25 mg total) by mouth 2 (two) times  daily.   nitroGLYCERIN 0.4 MG SL tablet Commonly known as: NITROSTAT Place 1 tablet (0.4 mg total) under the tongue every 5 (five) minutes as needed for chest pain.   rosuvastatin 40 MG tablet Commonly known as: CRESTOR Take 1 tablet (40 mg total) by mouth daily.   sulfamethoxazole-trimethoprim 800-160 MG tablet Commonly known as: BACTRIM DS Take 1 tablet by mouth every 12 (twelve) hours.   ticagrelor 90 MG Tabs tablet Commonly known as: BRILINTA Take 1 tablet (90 mg total) by mouth 2 (two) times daily.          Outstanding Labs/Studies   None  Duration of Discharge Encounter   Greater than 30 minutes including physician time.  Lorelei Pont, PA 06/12/2020, 10:16 AM

## 2020-06-12 NOTE — Progress Notes (Signed)
Patient discharge instructions reviewed and follow up appointments, patient verbalized understanding and written copy given.  Patient via wheelchair to waiting sister's car in stable condition.

## 2020-06-12 NOTE — TOC Benefit Eligibility Note (Signed)
Transition of Care [facility]) Benefit Eligibility Note    Patient Details  Name: Veronica Bowen MRN: [id number] Date of Birth: 12/21/1955   Medication/Dose: Brilinta 90 mg. BID  Covered?: Yes  Tier: Other (No Tier)  Prescription Coverage Preferred Pharmacy: [facility] with Person/Company/Phone Number:: Kermit Balo [facility]  PH# [phone]  Co-Pay: $32.05  Prior Approval: No  Deductible:  (No Deductible)       Renie Ora Phone Number: 06/12/2020, 11:40 AM

## 2020-06-12 NOTE — Progress Notes (Signed)
CARDIAC REHAB PHASE I   PRE:  Rate/Rhythm: 76 SR    BP: sitting 124/56    SaO2:   MODE:  Ambulation: 340 ft   POST:  Rate/Rhythm: 98 SR    BP: sitting 122/42     SaO2:   Tolerated well, no c/o. Discussed MI, stent, Brilinta importance, restrictions, diet, exercise, NTG and CRPII.  Pt receptive and is trying to get her head around changes. She voiced understanding regarding importance of Brilinta. Will refer to [place] CRPII. Pt is interested in participating in Virtual Cardiac and Pulmonary Rehab. Pt advised that Virtual Cardiac and Pulmonary Rehab is provided at no cost to the patient.  Checklist:  1. Pt has smart device  ie smartphone and/or ipad for downloading an app  Yes 2. Reliable internet/wifi service    Yes 3. Understands how to use their smartphone and navigate within an app.  Yes Pt verbalized understanding and is in agreement. [id number]  Harriet Masson CES, ACSM 06/12/2020 10:18 AM

## 2020-06-13 NOTE — Telephone Encounter (Signed)
Patient contacted regarding discharge from [facility] on 07/07.  Patient understands to follow up with provider Corine Shelter, PA on 07/29 at 2:15 at [facility] office. Patient understands discharge instructions? Yes Patient understands medications and regiment? Yes Patient understands to bring all medications to this visit? Yes  Patient was unable to make original appointment on 07/21- so she requested to move appointment, I advised of next spot was 07/29 and patient took this appointment.

## 2020-06-20 ENCOUNTER — Telehealth (HOSPITAL_COMMUNITY): Payer: Self-pay

## 2020-06-20 NOTE — Telephone Encounter (Signed)
Pt insurance is active and benefits verified through [employer] Co-pay 0, DED $250/$196.66 met, out of pocket $2,400/$75 met, co-insurance 10%. no pre-authorization required. Passport, 06/19/2020_0 :22pm, REF# [id number]  Will contact patient to see if she is interested in the Cardiac Rehab Program. If interested, patient will need to complete follow up appt. Once completed, patient will be contacted for scheduling upon review by the RN Navigator.

## 2020-06-26 ENCOUNTER — Ambulatory Visit: Payer: BC Managed Care – PPO | Admitting: Physician Assistant

## 2020-07-04 ENCOUNTER — Encounter: Payer: Self-pay | Admitting: Cardiology

## 2020-07-04 ENCOUNTER — Other Ambulatory Visit: Payer: Self-pay

## 2020-07-04 ENCOUNTER — Ambulatory Visit (INDEPENDENT_AMBULATORY_CARE_PROVIDER_SITE_OTHER): Payer: BC Managed Care – PPO | Admitting: Cardiology

## 2020-07-04 DIAGNOSIS — I2510 Atherosclerotic heart disease of native coronary artery without angina pectoris: Secondary | ICD-10-CM | POA: Diagnosis not present

## 2020-07-04 DIAGNOSIS — E7800 Pure hypercholesterolemia, unspecified: Secondary | ICD-10-CM | POA: Diagnosis not present

## 2020-07-04 DIAGNOSIS — I214 Non-ST elevation (NSTEMI) myocardial infarction: Secondary | ICD-10-CM

## 2020-07-04 DIAGNOSIS — Z9861 Coronary angioplasty status: Secondary | ICD-10-CM

## 2020-07-04 DIAGNOSIS — I10 Essential (primary) hypertension: Secondary | ICD-10-CM | POA: Diagnosis not present

## 2020-07-04 MED ORDER — TICAGRELOR 90 MG PO TABS: 90.0000 mg | ORAL_TABLET | Freq: Two times a day (BID) | ORAL | 3 refills | Status: DC

## 2020-07-04 MED ORDER — METOPROLOL TARTRATE 25 MG PO TABS: 25.0000 mg | ORAL_TABLET | Freq: Two times a day (BID) | ORAL | 3 refills | Status: DC

## 2020-07-04 MED ORDER — ROSUVASTATIN CALCIUM 40 MG PO TABS: 40.0000 mg | ORAL_TABLET | Freq: Every day | ORAL | 3 refills | Status: DC

## 2020-07-04 MED ORDER — LOSARTAN POTASSIUM-HCTZ 50-12.5 MG PO TABS: 1.0000 | ORAL_TABLET | Freq: Every day | ORAL | 3 refills | Status: DC

## 2020-07-04 MED ORDER — NITROGLYCERIN 0.4 MG SL SUBL: 0.4000 mg | SUBLINGUAL_TABLET | SUBLINGUAL | 12 refills | Status: DC | PRN

## 2020-07-04 NOTE — Progress Notes (Signed)
Cardiology Office Note:    Date:  07/04/2020   ID:  Veronica Bowen, DOB [birth year], MRN [id number]  PCP:  Renaye Rakers, MD  Cardiologist:  Rollene Rotunda, MD  Electrophysiologist:  None   Referring MD: Renaye Rakers, MD   No chief complaint on file. Post hospital follow up  History of Present Illness:    Veronica Bowen is a pleasant 63 y.o.AA female with a hx of HTN and HLD who presented to [facility] on 06/07/2020 with a NSTEMI.  Her Troponin peaked at 800.  She underwent cath and LAD PCI with DES 06/11/2020.  Echo showed normal LVF with moderate LVH.  She presents to the office today for follow up. Since discharge she has felt well, no chest pain.  She is retired so doesn't need to stay out of work, I did advise her to take in easy for another two weeks.   Past Medical History:  Diagnosis Date  . Arrhythmia   . Chest pain 06/07/2020  . Hyperlipidemia   . Hypertension   . NSTEMI (non-ST elevated myocardial infarction) (HCC)   . Supraventricular tachycardia (HCC) 06/07/2020  . Urinary tract infection 06/07/2020    Past Surgical History:  Procedure Laterality Date  . CESAREAN SECTION  1989  . CORONARY STENT INTERVENTION N/A 06/11/2020   Procedure: CORONARY STENT INTERVENTION;  Surgeon: Marykay Lex, MD;  Location: [facility] INVASIVE CV LAB;  Service: Cardiovascular;  Laterality: N/A;  . FOOT SURGERY Right    hammer toes  both left and right  . LEFT HEART CATH AND CORONARY ANGIOGRAPHY N/A 06/11/2020   Procedure: LEFT HEART CATH AND CORONARY ANGIOGRAPHY;  Surgeon: Marykay Lex, MD;  Location: [facility] INVASIVE CV LAB;  Service: Cardiovascular;  Laterality: N/A;    Current Medications: Current Meds  Medication Sig  . aspirin 81 MG chewable tablet Chew 1 tablet (81 mg total) by mouth daily.  Marland Kitchen losartan-hydrochlorothiazide (HYZAAR) 50-12.5 MG tablet Take 1 tablet by mouth daily.  . metoprolol tartrate (LOPRESSOR) 25 MG tablet Take 1 tablet (25 mg total) by mouth 2 (two) times daily.  . nitroGLYCERIN  (NITROSTAT) 0.4 MG SL tablet Place 1 tablet (0.4 mg total) under the tongue every 5 (five) minutes as needed for chest pain.  . rosuvastatin (CRESTOR) 40 MG tablet Take 1 tablet (40 mg total) by mouth daily.  Marland Kitchen sulfamethoxazole-trimethoprim (BACTRIM DS) 800-160 MG tablet Take 1 tablet by mouth every 12 (twelve) hours.  . ticagrelor (BRILINTA) 90 MG TABS tablet Take 1 tablet (90 mg total) by mouth 2 (two) times daily.  . [DISCONTINUED] losartan-hydrochlorothiazide (HYZAAR) 50-12.5 MG tablet Take 1 tablet by mouth daily.  . [DISCONTINUED] metoprolol tartrate (LOPRESSOR) 25 MG tablet Take 1 tablet (25 mg total) by mouth 2 (two) times daily.  . [DISCONTINUED] nitroGLYCERIN (NITROSTAT) 0.4 MG SL tablet Place 1 tablet (0.4 mg total) under the tongue every 5 (five) minutes as needed for chest pain.  . [DISCONTINUED] rosuvastatin (CRESTOR) 40 MG tablet Take 1 tablet (40 mg total) by mouth daily.  . [DISCONTINUED] ticagrelor (BRILINTA) 90 MG TABS tablet Take 1 tablet (90 mg total) by mouth 2 (two) times daily.     Allergies:   Patient has no known allergies.   Social History   Socioeconomic History  . Marital status: Single    Spouse name: Not on file  . Number of children: 2  . Years of education: Not on file  . Highest education level: Not on file  Occupational History  . Not on file  Tobacco Use  . Smoking status: Never Smoker  . Smokeless tobacco: Never Used  Vaping Use  . Vaping Use: Never used  Substance and Sexual Activity  . Alcohol use: Not on file    Comment: occ wine  . Drug use: Never  . Sexual activity: Not on file  Other Topics Concern  . Not on file  Social History Narrative  . Not on file   Social Determinants of Health   Financial Resource Strain:   . Difficulty of Paying Living Expenses:   Food Insecurity:   . Worried About Programme researcher, broadcasting/film/video in the Last Year:   . Barista in the Last Year:   Transportation Needs:   . Freight forwarder (Medical):    Marland Kitchen Lack of Transportation (Non-Medical):   Physical Activity:   . Days of Exercise per Week:   . Minutes of Exercise per Session:   Stress:   . Feeling of Stress :   Social Connections:   . Frequency of Communication with Friends and Family:   . Frequency of Social Gatherings with Friends and Family:   . Attends Religious Services:   . Active Member of Clubs or Organizations:   . Attends Banker Meetings:   Marland Kitchen Marital Status:      Family History: The patient's family history includes Diabetes in her father, mother, and sister.  ROS:   Please see the history of present illness.     All other systems reviewed and are negative.  EKGs/Labs/Other Studies Reviewed:    The following studies were reviewed today: Cath/PCI- 06/11/2020 Echo 06/09/2020- IMPRESSIONS    1. Left ventricular ejection fraction, by estimation, is 70 to 75%. The  left ventricle has hyperdynamic function. The left ventricle has no  regional wall motion abnormalities. There is moderate left ventricular  hypertrophy. Left ventricular diastolic  parameters are indeterminate.  2. Right ventricular systolic function is normal. The right ventricular  size is normal. Tricuspid regurgitation signal is inadequate for assessing  PA pressure.  3. The mitral valve is grossly normal. Trivial mitral valve  regurgitation.  4. The aortic valve is tricuspid. Aortic valve regurgitation is not  visualized.  5. The inferior vena cava is normal in size with greater than 50%  respiratory variability, suggesting right atrial pressure of 3 mmHg.   EKG:  EKG is ordered today.  The ekg ordered today demonstrates NSR, HR 74, lateral TWI  Recent Labs: 06/08/2020: TSH 3.061 06/11/2020: BUN 20; Creatinine, Ser 0.95; Hemoglobin 12.9; Platelets 201; Potassium 4.2; Sodium 135  Recent Lipid Panel    Component Value Date/Time   CHOL 121 06/08/2020 0224   TRIG 74 06/08/2020 0224   HDL 47 06/08/2020 0224   CHOLHDL 2.6  06/08/2020 0224   VLDL 15 06/08/2020 0224   LDLCALC 59 06/08/2020 0224    Physical Exam:    VS:  BP 125/85   Pulse 74   Temp 97.9 F (36.6 C)   Ht 5\' 5"  (1.651 m)   Wt 193 lb 6.4 oz (87.7 kg)   SpO2 96%   BMI 32.18 kg/m     Wt Readings from Last 3 Encounters:  07/04/20 193 lb 6.4 oz (87.7 kg)  06/10/20 191 lb 12.8 oz (87 kg)  11/13/19 188 lb 9.6 oz (85.5 kg)     GEN: Well nourished, well developed in no acute distress HEENT: Normal NECK: No JVD; No carotid bruits CARDIAC: RRR, no murmurs, rubs, gallops RESPIRATORY:  Clear to auscultation  without rales, wheezing or rhonchi  ABDOMEN: Soft, non-tender, non-distended MUSCULOSKELETAL:  No edema; No deformity  SKIN: Warm and dry NEUROLOGIC:  Alert and oriented x 3 PSYCHIATRIC:  Normal affect   ASSESSMENT:    NSTEMI (non-ST elevated myocardial infarction) (HCC) NSTEMI 06/07/2020- troponin peak in the 800 range  CAD S/P percutaneous coronary angioplasty LAD PCI with DES 06/11/2020, no other significant CAD at cath and normal LVF  Hypertension Controlled- previous medications changed post PCI  Hyperlipidemia Discharged on high dose statin Rx- LDL 59  PLAN:    I suggested cardiac rehab.  We discussed heart healthy diet and the need for medication compliance.  F/U Dr Antoine Poche in 3 months.    Medication Adjustments/Labs and Tests Ordered: Current medicines are reviewed at length with the patient today.  Concerns regarding medicines are outlined above.  Orders Placed This Encounter  Procedures  . AMB referral to cardiac rehabilitation  . EKG 12-Lead   Meds ordered this encounter  Medications  . losartan-hydrochlorothiazide (HYZAAR) 50-12.5 MG tablet    Sig: Take 1 tablet by mouth daily.    Dispense:  90 tablet    Refill:  3  . metoprolol tartrate (LOPRESSOR) 25 MG tablet    Sig: Take 1 tablet (25 mg total) by mouth 2 (two) times daily.    Dispense:  90 tablet    Refill:  3  . rosuvastatin (CRESTOR) 40 MG tablet     Sig: Take 1 tablet (40 mg total) by mouth daily.    Dispense:  90 tablet    Refill:  3  . ticagrelor (BRILINTA) 90 MG TABS tablet    Sig: Take 1 tablet (90 mg total) by mouth 2 (two) times daily.    Dispense:  90 tablet    Refill:  3  . nitroGLYCERIN (NITROSTAT) 0.4 MG SL tablet    Sig: Place 1 tablet (0.4 mg total) under the tongue every 5 (five) minutes as needed for chest pain.    Dispense:  25 tablet    Refill:  12    There are no Patient Instructions on file for this visit.   Signed, Corine Shelter, PA-C  07/04/2020 3:36 PM    [facility]

## 2020-07-04 NOTE — Assessment & Plan Note (Signed)
LAD PCI with DES 06/11/2020, no other significant CAD at cath and normal LVF

## 2020-07-04 NOTE — Assessment & Plan Note (Signed)
Discharged on high dose statin Rx- LDL 59

## 2020-07-04 NOTE — Patient Instructions (Signed)
Medication Instructions:  Your physician recommends that you continue on your current medications as directed. Please refer to the Current Medication list given to you today.  *If you need a refill on your cardiac medications before your next appointment, please call your pharmacy*   Follow-Up: At [facility], you and your health needs are our priority.  As part of our continuing mission to provide you with exceptional heart care, we have created designated Provider Care Teams.  These Care Teams include your primary Cardiologist (physician) and Advanced Practice Providers (APPs -  Physician Assistants and Nurse Practitioners) who all work together to provide you with the care you need, when you need it.  We recommend signing up for the patient portal called "MyChart".  Sign up information is provided on this After Visit Summary.  MyChart is used to connect with patients for Virtual Visits (Telemedicine).  Patients are able to view lab/test results, encounter notes, upcoming appointments, etc.  Non-urgent messages can be sent to your provider as well.   To learn more about what you can do with MyChart, go to [email or web address].    Your next appointment:   3 month(s)  The format for your next appointment:   In Person  Provider:   Rollene Rotunda, MD   Other Instructions You have been referred to Cardiac Rehab. They will you call you to schedule.   Heart-Healthy Eating Plan Many factors influence your heart (coronary) health, including eating and exercise habits. Coronary risk increases with abnormal blood fat (lipid) levels. Heart-healthy meal planning includes limiting unhealthy fats, increasing healthy fats, and making other diet and lifestyle changes. What is my plan? Your health care provider may recommend that you:  Limit your fat intake to _________% or less of your total calories each day.  Limit your saturated fat intake to _________% or less of your total calories each  day.  Limit the amount of cholesterol in your diet to less than _________ mg per day. What are tips for following this plan? Cooking Cook foods using methods other than frying. Baking, boiling, grilling, and broiling are all good options. Other ways to reduce fat include:  Removing the skin from poultry.  Removing all visible fats from meats.  Steaming vegetables in water or broth. Meal planning   At meals, imagine dividing your plate into fourths: ? Fill one-half of your plate with vegetables and green salads. ? Fill one-fourth of your plate with whole grains. ? Fill one-fourth of your plate with lean protein foods.  Eat 4-5 servings of vegetables per day. One serving equals 1 cup raw or cooked vegetable, or 2 cups raw leafy greens.  Eat 4-5 servings of fruit per day. One serving equals 1 medium whole fruit,  cup dried fruit,  cup fresh, frozen, or canned fruit, or  cup 100% fruit juice.  Eat more foods that contain soluble fiber. Examples include apples, broccoli, carrots, beans, peas, and barley. Aim to get 25-30 g of fiber per day.  Increase your consumption of legumes, nuts, and seeds to 4-5 servings per week. One serving of dried beans or legumes equals  cup cooked, 1 serving of nuts is  cup, and 1 serving of seeds equals 1 tablespoon. Fats  Choose healthy fats more often. Choose monounsaturated and polyunsaturated fats, such as olive and canola oils, flaxseeds, walnuts, almonds, and seeds.  Eat more omega-3 fats. Choose salmon, mackerel, sardines, tuna, flaxseed oil, and ground flaxseeds. Aim to eat fish at least 2 times each week.  Check food labels carefully to identify foods with trans fats or high amounts of saturated fat.  Limit saturated fats. These are found in animal products, such as meats, butter, and cream. Plant sources of saturated fats include palm oil, palm kernel oil, and coconut oil.  Avoid foods with partially hydrogenated oils in them. These contain  trans fats. Examples are stick margarine, some tub margarines, cookies, crackers, and other baked goods.  Avoid fried foods. General information  Eat more home-cooked food and less restaurant, buffet, and fast food.  Limit or avoid alcohol.  Limit foods that are high in starch and sugar.  Lose weight if you are overweight. Losing just 5-10% of your body weight can help your overall health and prevent diseases such as diabetes and heart disease.  Monitor your salt (sodium) intake, especially if you have high blood pressure. Talk with your health care provider about your sodium intake.  Try to incorporate more vegetarian meals weekly. What foods can I eat? Fruits All fresh, canned (in natural juice), or frozen fruits. Vegetables Fresh or frozen vegetables (raw, steamed, roasted, or grilled). Green salads. Grains Most grains. Choose whole wheat and whole grains most of the time. Rice and pasta, including brown rice and pastas made with whole wheat. Meats and other proteins Lean, well-trimmed beef, veal, pork, and lamb. Chicken and [place] without skin. All fish and shellfish. Wild duck, rabbit, pheasant, and venison. Egg whites or low-cholesterol egg substitutes. Dried beans, peas, lentils, and tofu. Seeds and most nuts. Dairy Low-fat or nonfat cheeses, including ricotta and mozzarella. Skim or 1% milk (liquid, powdered, or evaporated). Buttermilk made with low-fat milk. Nonfat or low-fat yogurt. Fats and oils Non-hydrogenated (trans-free) margarines. Vegetable oils, including soybean, sesame, sunflower, olive, peanut, safflower, corn, canola, and cottonseed. Salad dressings or mayonnaise made with a vegetable oil. Beverages Water (mineral or sparkling). Coffee and tea. Diet carbonated beverages. Sweets and desserts Sherbet, gelatin, and fruit ice. Small amounts of dark chocolate. Limit all sweets and desserts. Seasonings and condiments All seasonings and condiments. The items listed  above may not be a complete list of foods and beverages you can eat. Contact a dietitian for more options. What foods are not recommended? Fruits Canned fruit in heavy syrup. Fruit in cream or butter sauce. Fried fruit. Limit coconut. Vegetables Vegetables cooked in cheese, cream, or butter sauce. Fried vegetables. Grains Breads made with saturated or trans fats, oils, or whole milk. Croissants. Sweet rolls. Donuts. High-fat crackers, such as cheese crackers. Meats and other proteins Fatty meats, such as hot dogs, ribs, sausage, bacon, rib-eye roast or steak. High-fat deli meats, such as salami and bologna. Caviar. Domestic duck and goose. Organ meats, such as liver. Dairy Cream, sour cream, cream cheese, and creamed cottage cheese. Whole milk cheeses. Whole or 2% milk (liquid, evaporated, or condensed). Whole buttermilk. Cream sauce or high-fat cheese sauce. Whole-milk yogurt. Fats and oils Meat fat, or shortening. Cocoa butter, hydrogenated oils, palm oil, coconut oil, palm kernel oil. Solid fats and shortenings, including bacon fat, salt pork, lard, and butter. Nondairy cream substitutes. Salad dressings with cheese or sour cream. Beverages Regular sodas and any drinks with added sugar. Sweets and desserts Frosting. Pudding. Cookies. Cakes. Pies. Milk chocolate or white chocolate. Buttered syrups. Full-fat ice cream or ice cream drinks. The items listed above may not be a complete list of foods and beverages to avoid. Contact a dietitian for more information. Summary  Heart-healthy meal planning includes limiting unhealthy fats, increasing healthy fats, and making  other diet and lifestyle changes.  Lose weight if you are overweight. Losing just 5-10% of your body weight can help your overall health and prevent diseases such as diabetes and heart disease.  Focus on eating a balance of foods, including fruits and vegetables, low-fat or nonfat dairy, lean protein, nuts and legumes, whole  grains, and heart-healthy oils and fats. This information is not intended to replace advice given to you by your health care provider. Make sure you discuss any questions you have with your health care provider. Document Revised: 12/31/2017 Document Reviewed: 12/31/2017 Elsevier Patient Education  2020 [employer].

## 2020-07-04 NOTE — Assessment & Plan Note (Signed)
NSTEMI 06/07/2020- troponin peak in the 800 range

## 2020-07-04 NOTE — Assessment & Plan Note (Signed)
Controlled- previous medications changed post PCI

## 2020-07-08 NOTE — Telephone Encounter (Signed)
Called patient to see if she was interested in participating in the Cardiac Rehab Program. Patient stated yes. Patient will come in for orientation on 08/01/2020 @ 8AM and will attend the 845AM exercise class.  Mailed letter.

## 2020-07-11 ENCOUNTER — Telehealth: Payer: Self-pay | Admitting: Cardiology

## 2020-07-11 NOTE — Telephone Encounter (Signed)
Patient called in to clinic with concerns about paying for her brillenta and asked for a prescription coupon card if available. Prescription discount card obtained and placed in envelope at front desk for patient to pick up when able. Patient notified that card was available for pick up and to call back to office if needed. Patient verbalized understanding.

## 2020-07-11 NOTE — Telephone Encounter (Signed)
Pt c/o medication issue:  1. Name of Medication: ticagrelor (BRILINTA) 90 MG TABS tablet  2. How are you currently taking this medication (dosage and times per day)? As directed  3. Are you having a reaction (difficulty breathing--STAT)? No  4. What is your medication issue? Patient states she is not able to afford the medication and she is requesting to coupon to assist with purchasing.

## 2020-07-23 ENCOUNTER — Telehealth (HOSPITAL_COMMUNITY): Payer: Self-pay | Admitting: Student-PharmD

## 2020-07-24 ENCOUNTER — Telehealth (HOSPITAL_COMMUNITY): Payer: Self-pay

## 2020-07-24 NOTE — Telephone Encounter (Signed)
Cardiac Rehab Note:  Unsuccessful telephone call to Veronica Bowen to confirm appointment for cardiac rehab 08/01/20 at 8:00 am. Hipaa compliant VM message left requesting call back to [phone].  Anieya Helman E. Suzie Portela RN, BSN [place]. [facility]  Cardiac and Pulmonary Rehabilitation Phone: [phone] Fax: [phone]

## 2020-07-30 ENCOUNTER — Telehealth (HOSPITAL_COMMUNITY): Payer: Self-pay

## 2020-07-30 NOTE — Telephone Encounter (Signed)
Cardiac Rehab Note:  Second unsuccessful telephone call to Fatma Rutten to confirm appointment for cardiac rehab 08/01/20 at 8:00 am. Hipaa compliant VM message left requesting call back to [phone].  Shataria Crist E. Suzie Portela RN, BSN [place]. [facility]  Cardiac and Pulmonary Rehabilitation Phone: [phone] Fax: [phone]

## 2020-07-31 ENCOUNTER — Telehealth (HOSPITAL_COMMUNITY): Payer: Self-pay | Admitting: *Deleted

## 2020-07-31 NOTE — Telephone Encounter (Signed)
Cardiac Rehab - Pharmacy Resident Documentation   Patient unable to be reached after three call attempts. Please complete allergy verification and medication review during patient's cardiac rehab appointment.     

## 2020-07-31 NOTE — Telephone Encounter (Signed)
Left message to call cardiac rehab regarding cardiac rehab orientation.Gladstone Lighter, RN,BSN 07/31/2020 2:05 PM

## 2020-08-01 ENCOUNTER — Ambulatory Visit (HOSPITAL_COMMUNITY): Payer: BC Managed Care – PPO

## 2020-08-05 ENCOUNTER — Ambulatory Visit (HOSPITAL_COMMUNITY): Payer: BC Managed Care – PPO

## 2020-08-07 ENCOUNTER — Encounter (HOSPITAL_COMMUNITY): Payer: Self-pay

## 2020-08-07 ENCOUNTER — Ambulatory Visit (HOSPITAL_COMMUNITY): Payer: BC Managed Care – PPO

## 2020-08-09 ENCOUNTER — Ambulatory Visit (HOSPITAL_COMMUNITY): Payer: BC Managed Care – PPO

## 2020-08-14 ENCOUNTER — Ambulatory Visit (HOSPITAL_COMMUNITY): Payer: BC Managed Care – PPO

## 2020-08-15 ENCOUNTER — Telehealth: Payer: Self-pay | Admitting: Cardiology

## 2020-08-15 NOTE — Telephone Encounter (Signed)
    Pt c/o medication issue:  1. Name of Medication: ticagrelor (BRILINTA) 90 MG TABS tablet  2. How are you currently taking this medication (dosage and times per day)?  Take 1 tablet (90 mg total) by mouth 2 (two) times daily.  3. Are you having a reaction (difficulty breathing--STAT)?   4. What is your medication issue? Pt said the pharmacy giving her 90 days supply but the coupon card she have is only good for one month. She would like to get a coupon for 90 days. She can't afford the cost without it

## 2020-08-15 NOTE — Telephone Encounter (Signed)
Prescription in chart is for 90 tablets.  I spoke with [facility] on [address] and they confirm prescription was filled for month supply (60 tablets) and new prescription does not need to be sent in.  They will fill monthly.  Per [facility] patient used free month card.  She would be eligible to use copay card but would need to bring to the pharmacy when picking up her medication.  I spoke with patient and explained to her she could use copay card and cost would be $5 per month.  Copay card will be left at front desk and patient will come to office to pick up.

## 2020-08-16 ENCOUNTER — Ambulatory Visit (HOSPITAL_COMMUNITY): Payer: BC Managed Care – PPO

## 2020-08-19 ENCOUNTER — Ambulatory Visit (HOSPITAL_COMMUNITY): Payer: BC Managed Care – PPO

## 2020-08-20 ENCOUNTER — Telehealth (HOSPITAL_COMMUNITY): Payer: Self-pay

## 2020-08-20 NOTE — Telephone Encounter (Signed)
No response from pt regarding CR.  Closed referral.  

## 2020-08-21 ENCOUNTER — Ambulatory Visit (HOSPITAL_COMMUNITY): Payer: BC Managed Care – PPO

## 2020-08-21 NOTE — Telephone Encounter (Signed)
Called patient, she states she tried to go pick up her Brilinta but was still unable to use the COPAY card- she did advise she has used one before, and normally they only allow one use for any card like that. She states she is unable to afford it- I did offer her the option of patient assistance, and she would like it mailed to her- but states she may not qualify, and would like to know before going through all this trouble for the paperwork if she can just take something else other than the Brilinta. I advised I would check with Dr.Hochrein and go from there. Patient verbalized understanding.  If he would like to do patient assistance- we will mail.  If he would like to change the medication- we would let her know.  Patient verbalized understanding.

## 2020-08-21 NOTE — Telephone Encounter (Signed)
Veronica Bowen is calling stating she went to pickup her prescription and they advised what she gave them for the discount would not work. Please advise.

## 2020-08-22 NOTE — Telephone Encounter (Signed)
LMOM for patient - need to verify that she has the correct card - reads $5/month and not the One Month Free

## 2020-08-22 NOTE — Telephone Encounter (Signed)
Can somebody look into helping with this situation.  Otherwise we will need to switch to Plavix.

## 2020-08-23 ENCOUNTER — Ambulatory Visit (HOSPITAL_COMMUNITY): Payer: BC Managed Care – PPO

## 2020-08-26 ENCOUNTER — Ambulatory Visit (HOSPITAL_COMMUNITY): Payer: BC Managed Care – PPO

## 2020-08-26 NOTE — Telephone Encounter (Signed)
Patient returning call.

## 2020-08-26 NOTE — Telephone Encounter (Signed)
Patient was told she could use co-pay card only once.   I called patient's prefer pharmacy. Co-pay information not on records. New card number provoided and co-pay now $5.  Patient notified

## 2020-08-28 ENCOUNTER — Ambulatory Visit (HOSPITAL_COMMUNITY): Payer: BC Managed Care – PPO

## 2020-08-30 ENCOUNTER — Ambulatory Visit (HOSPITAL_COMMUNITY): Payer: BC Managed Care – PPO

## 2020-08-30 DIAGNOSIS — E782 Mixed hyperlipidemia: Secondary | ICD-10-CM | POA: Diagnosis not present

## 2020-08-30 DIAGNOSIS — I10 Essential (primary) hypertension: Secondary | ICD-10-CM | POA: Diagnosis not present

## 2020-08-30 DIAGNOSIS — R7303 Prediabetes: Secondary | ICD-10-CM | POA: Diagnosis not present

## 2020-09-02 ENCOUNTER — Ambulatory Visit (HOSPITAL_COMMUNITY): Payer: BC Managed Care – PPO

## 2020-09-04 ENCOUNTER — Ambulatory Visit (HOSPITAL_COMMUNITY): Payer: BC Managed Care – PPO

## 2020-09-06 ENCOUNTER — Ambulatory Visit (HOSPITAL_COMMUNITY): Payer: BC Managed Care – PPO

## 2020-09-09 ENCOUNTER — Ambulatory Visit (HOSPITAL_COMMUNITY): Payer: BC Managed Care – PPO

## 2020-09-11 ENCOUNTER — Ambulatory Visit (HOSPITAL_COMMUNITY): Payer: BC Managed Care – PPO

## 2020-09-13 ENCOUNTER — Ambulatory Visit (HOSPITAL_COMMUNITY): Payer: BC Managed Care – PPO

## 2020-09-16 ENCOUNTER — Ambulatory Visit (HOSPITAL_COMMUNITY): Payer: BC Managed Care – PPO

## 2020-09-18 ENCOUNTER — Ambulatory Visit (HOSPITAL_COMMUNITY): Payer: BC Managed Care – PPO

## 2020-09-20 ENCOUNTER — Ambulatory Visit (HOSPITAL_COMMUNITY): Payer: BC Managed Care – PPO

## 2020-09-23 ENCOUNTER — Ambulatory Visit (HOSPITAL_COMMUNITY): Payer: BC Managed Care – PPO

## 2020-09-25 ENCOUNTER — Ambulatory Visit (HOSPITAL_COMMUNITY): Payer: BC Managed Care – PPO

## 2020-09-27 ENCOUNTER — Ambulatory Visit (HOSPITAL_COMMUNITY): Payer: BC Managed Care – PPO

## 2020-10-03 ENCOUNTER — Ambulatory Visit: Payer: BC Managed Care – PPO | Admitting: Cardiology

## 2020-11-14 NOTE — Progress Notes (Signed)
Cardiology Office Note   Date:  11/15/2020   ID:  Veronica Bowen, DOB [birth year], MRN [id number]  PCP:  Renaye Rakers, MD  Cardiologist:   Rollene Rotunda, MD   Chief Complaint  Patient presents with  . Coronary Artery Disease      History of Present Illness: Veronica Bowen is a 64 y.o. female who presents for follow up of NSTEMI with subsequent PCI of an LAD lesion.   Since she got out of the hospital she has done well.  She does have some heartburn but does not feel exactly like she had when she presented to the emergency room.  She thinks she is just anxious.  For some reason between the appointment when she followed up immediately after hospitalization and now aspirin, Crestor and losartan HCT had fallen off her med list.  She is not doing much physical activity.  She is not walking routinely.  With her activities of daily living she is not able to bring on chest pressure, neck or arm discomfort.  She is not having any new shortness of breath, PND or orthopnea.  She has had no weight gain or edema.  Past Medical History:  Diagnosis Date  . Arrhythmia   . Chest pain 06/07/2020  . Hyperlipidemia   . Hypertension   . NSTEMI (non-ST elevated myocardial infarction) (HCC)   . Supraventricular tachycardia (HCC) 06/07/2020  . Urinary tract infection 06/07/2020    Past Surgical History:  Procedure Laterality Date  . CESAREAN SECTION  1989  . CORONARY STENT INTERVENTION N/A 06/11/2020   Procedure: CORONARY STENT INTERVENTION;  Surgeon: Marykay Lex, MD;  Location: [facility] INVASIVE CV LAB;  Service: Cardiovascular;  Laterality: N/A;  . FOOT SURGERY Right    hammer toes  both left and right  . LEFT HEART CATH AND CORONARY ANGIOGRAPHY N/A 06/11/2020   Procedure: LEFT HEART CATH AND CORONARY ANGIOGRAPHY;  Surgeon: Marykay Lex, MD;  Location: [facility] INVASIVE CV LAB;  Service: Cardiovascular;  Laterality: N/A;     Current Outpatient Medications  Medication Sig Dispense Refill  . metoprolol  tartrate (LOPRESSOR) 25 MG tablet Take 1 tablet (25 mg total) by mouth 2 (two) times daily. 90 tablet 3  . nitroGLYCERIN (NITROSTAT) 0.4 MG SL tablet Place 1 tablet (0.4 mg total) under the tongue every 5 (five) minutes as needed for chest pain. 25 tablet 12  . ticagrelor (BRILINTA) 90 MG TABS tablet Take 1 tablet (90 mg total) by mouth 2 (two) times daily. 90 tablet 3  . aspirin 81 MG chewable tablet Chew 1 tablet (81 mg total) by mouth daily. 30 tablet 6  . losartan (COZAAR) 25 MG tablet Take 1 tablet (25 mg total) by mouth daily. 90 tablet 3  . rosuvastatin (CRESTOR) 40 MG tablet Take 1 tablet (40 mg total) by mouth daily. 90 tablet 3   No current facility-administered medications for this visit.    Allergies:   Patient has no known allergies.    ROS:  Please see the history of present illness.   Otherwise, review of systems are positive for insomnia.   All other systems are reviewed and negative.    PHYSICAL EXAM: VS:  BP 130/74   Pulse 80   Ht 5\' 5"  (1.651 m)   Wt 202 lb 12.8 oz (92 kg)   SpO2 97%   BMI 33.75 kg/m  , BMI Body mass index is 33.75 kg/m. GENERAL:  Well appearing HEENT:  Pupils equal round and reactive, fundi not  visualized, oral mucosa unremarkable NECK:  No jugular venous distention, waveform within normal limits, carotid upstroke brisk and symmetric, no bruits, no thyromegaly LYMPHATICS:  No cervical, inguinal adenopathy LUNGS:  Clear to auscultation bilaterally BACK:  No CVA tenderness CHEST:  Unremarkable HEART:  PMI not displaced or sustained,S1 and S2 within normal limits, no S3, no S4, no clicks, no rubs, no murmurs ABD:  Flat, positive bowel sounds normal in frequency in pitch, no bruits, no rebound, no guarding, no midline pulsatile mass, no hepatomegaly, no splenomegaly EXT:  2 plus pulses throughout, no edema, no cyanosis no clubbing SKIN:  No rashes no nodules NEURO:  Cranial nerves II through XII grossly intact, motor grossly intact  throughout PSYCH:  Cognitively intact, oriented to person place and time    EKG:  EKG is not ordered today. NA   Recent Labs: 06/08/2020: TSH 3.061 06/11/2020: BUN 20; Creatinine, Ser 0.95; Hemoglobin 12.9; Platelets 201; Potassium 4.2; Sodium 135    Lipid Panel    Component Value Date/Time   CHOL 121 06/08/2020 0224   TRIG 74 06/08/2020 0224   HDL 47 06/08/2020 0224   CHOLHDL 2.6 06/08/2020 0224   VLDL 15 06/08/2020 0224   LDLCALC 59 06/08/2020 0224      Wt Readings from Last 3 Encounters:  11/15/20 202 lb 12.8 oz (92 kg)  07/04/20 193 lb 6.4 oz (87.7 kg)  06/10/20 191 lb 12.8 oz (87 kg)    Diagnostic Dominance: Right    Intervention       Other studies Reviewed: Additional studies/ records that were reviewed today include: Hospital discharge note. Review of the above records demonstrates:  Please see elsewhere in the note.     ASSESSMENT AND PLAN:  CAD:   The patient is having no chest pain consistent with her previous presentation.  She needs continued risk reduction.  She needs to be restarted on the meds that have fallen off her list and I am going to restart aspirin and Crestor.  I will change her blood pressure medicine below.  SVT:  She has had no further episodes of this.    HTN: Her blood pressure is controlled but I would like to restart the losartan 25 mg daily.  DYSLIPIDEMIA: I will restart the Crestor.  When she comes back for follow-up she should be fasting and get a lipid profile.  I will refer her to our care coordinator for discussion on exercise and diet.  SNORING:  I will consider a sleep study in the future.    Current medicines are reviewed at length with the patient today.  The patient does not have concerns regarding medicines.  The following changes have been made:  no change  Labs/ tests ordered today include: None No orders of the defined types were placed in this encounter.    Disposition:   FU with APP in 3 months.      Signed, Rollene Rotunda, MD  11/15/2020 9:45 AM    [facility]

## 2020-11-15 ENCOUNTER — Encounter: Payer: Self-pay | Admitting: Cardiology

## 2020-11-15 ENCOUNTER — Other Ambulatory Visit: Payer: Self-pay

## 2020-11-15 ENCOUNTER — Ambulatory Visit (INDEPENDENT_AMBULATORY_CARE_PROVIDER_SITE_OTHER): Payer: BC Managed Care – PPO | Admitting: Cardiology

## 2020-11-15 VITALS — BP 130/74 | HR 80 | Ht 65.0 in | Wt 202.8 lb

## 2020-11-15 DIAGNOSIS — I2510 Atherosclerotic heart disease of native coronary artery without angina pectoris: Secondary | ICD-10-CM

## 2020-11-15 DIAGNOSIS — E785 Hyperlipidemia, unspecified: Secondary | ICD-10-CM | POA: Diagnosis not present

## 2020-11-15 DIAGNOSIS — I10 Essential (primary) hypertension: Secondary | ICD-10-CM | POA: Diagnosis not present

## 2020-11-15 DIAGNOSIS — I471 Supraventricular tachycardia: Secondary | ICD-10-CM

## 2020-11-15 DIAGNOSIS — Z9861 Coronary angioplasty status: Secondary | ICD-10-CM

## 2020-11-15 MED ORDER — ROSUVASTATIN CALCIUM 40 MG PO TABS: 40.0000 mg | ORAL_TABLET | Freq: Every day | ORAL | 3 refills | Status: DC

## 2020-11-15 MED ORDER — LOSARTAN POTASSIUM 25 MG PO TABS
25.0000 mg | ORAL_TABLET | Freq: Every day | ORAL | 3 refills | Status: DC
Start: 2020-11-15 — End: 2021-03-04

## 2020-11-15 MED ORDER — ASPIRIN 81 MG PO CHEW: 81.0000 mg | CHEWABLE_TABLET | Freq: Every day | ORAL | 6 refills | Status: DC

## 2020-11-15 NOTE — Patient Instructions (Addendum)
Medication Instructions:  RESTART ASPIRIN 81MG   START COZAAR 25MG  DAILY  RESTART CRESTOR 40MG  DAILY IN THE EVENING  START MELATONIN OTC 3-5MG  TABLETS   *If you need a refill on your cardiac medications before your next appointment, please call your pharmacy*  Lab Work: NONE ORDERED THIS VISIT  Testing/Procedures: NONE ORDERED THIS VISIT  Follow-Up: At [facility], you and your health needs are our priority.  As part of our continuing mission to provide you with exceptional heart care, we have created designated Provider Care Teams.  These Care Teams include your primary Cardiologist (physician) and Advanced Practice Providers (APPs -  Physician Assistants and Nurse Practitioners) who all work together to provide you with the care you need, when you need it.  We recommend signing up for the patient portal called "MyChart".  Sign up information is provided on this After Visit Summary.  MyChart is used to connect with patients for Virtual Visits (Telemedicine).  Patients are able to view lab/test results, encounter notes, upcoming appointments, etc.  Non-urgent messages can be sent to your provider as well.   To learn more about what you can do with MyChart, go to .    Your next appointment:   3 month(s)  The format for your next appointment:   In Person  Provider:   ANY AVAILABLE APP  Other instructions: REFERRED TO CARE COORDINATOR FOR DIET AND EXERCISE

## 2020-11-16 ENCOUNTER — Other Ambulatory Visit: Payer: Self-pay | Admitting: Cardiology

## 2020-11-18 ENCOUNTER — Telehealth: Payer: Self-pay

## 2020-11-18 DIAGNOSIS — Z0000 Encounter for general adult medical examination without abnormal findings: Secondary | ICD-10-CM

## 2020-11-18 NOTE — Telephone Encounter (Signed)
Called patient to discuss health coaching to improve healthy eating and physical activity behaviors. Patient is ill and requested a call back. Will call patient on 11/22/20 to continue discussion.

## 2020-12-31 ENCOUNTER — Other Ambulatory Visit: Payer: Self-pay | Admitting: Cardiology

## 2021-01-15 ENCOUNTER — Other Ambulatory Visit: Payer: Self-pay | Admitting: Internal Medicine

## 2021-01-15 DIAGNOSIS — I2510 Atherosclerotic heart disease of native coronary artery without angina pectoris: Secondary | ICD-10-CM | POA: Diagnosis not present

## 2021-01-15 DIAGNOSIS — E559 Vitamin D deficiency, unspecified: Secondary | ICD-10-CM | POA: Diagnosis not present

## 2021-01-15 DIAGNOSIS — E7849 Other hyperlipidemia: Secondary | ICD-10-CM | POA: Diagnosis not present

## 2021-01-15 DIAGNOSIS — I10 Essential (primary) hypertension: Secondary | ICD-10-CM | POA: Diagnosis not present

## 2021-01-15 DIAGNOSIS — Z0000 Encounter for general adult medical examination without abnormal findings: Secondary | ICD-10-CM | POA: Diagnosis not present

## 2021-01-17 LAB — CBC
HCT: 34.8 % — ABNORMAL LOW (ref 35.0–45.0)
Hemoglobin: 11.6 g/dL — ABNORMAL LOW (ref 11.7–15.5)
MCH: 28.5 pg (ref 27.0–33.0)
MCHC: 33.3 g/dL (ref 32.0–36.0)
MCV: 85.5 fL (ref 80.0–100.0)
MPV: 10.8 fL (ref 7.5–12.5)
Platelets: 218 10*3/uL (ref 140–400)
RBC: 4.07 10*6/uL (ref 3.80–5.10)
RDW: 14.7 % (ref 11.0–15.0)
WBC: 4.4 10*3/uL (ref 3.8–10.8)

## 2021-01-17 LAB — LIPID PANEL
Cholesterol: 134 mg/dL (ref <200)
HDL: 45 mg/dL — ABNORMAL LOW (ref >=50)
LDL Cholesterol (Calc): 66 mg/dL (calc)
Non-HDL Cholesterol (Calc): 89 mg/dL (calc) (ref <130)
Total CHOL/HDL Ratio: 3 (calc) (ref <5.0)
Triglycerides: 143 mg/dL (ref <150)

## 2021-01-17 LAB — COMPLETE METABOLIC PANEL WITH GFR
AG Ratio: 1.2 (calc) (ref 1.0–2.5)
ALT: 21 U/L (ref 6–29)
AST: 20 U/L (ref 10–35)
Albumin: 4.1 g/dL (ref 3.6–5.1)
Alkaline phosphatase (APISO): 71 U/L (ref 37–153)
BUN: 15 mg/dL (ref 7–25)
CO2: 27 mmol/L (ref 20–32)
Calcium: 9.7 mg/dL (ref 8.6–10.4)
Chloride: 105 mmol/L (ref 98–110)
Creat: 0.96 mg/dL (ref 0.50–0.99)
GFR, Est African American: 72 mL/min/{1.73_m2} (ref >=60)
GFR, Est Non African American: 62 mL/min/{1.73_m2} (ref >=60)
Globulin: 3.4 g/dL (calc) (ref 1.9–3.7)
Glucose, Bld: 92 mg/dL (ref 65–99)
Potassium: 4.5 mmol/L (ref 3.5–5.3)
Sodium: 139 mmol/L (ref 135–146)
Total Bilirubin: 0.5 mg/dL (ref 0.2–1.2)
Total Protein: 7.5 g/dL (ref 6.1–8.1)

## 2021-01-17 LAB — T3: T3, Total: 163 ng/dL (ref 76–181)

## 2021-01-17 LAB — TSH: TSH: 4.93 mIU/L — ABNORMAL HIGH (ref 0.40–4.50)

## 2021-01-17 LAB — T3 UPTAKE: T3 Uptake: 26 % (ref 22–35)

## 2021-01-17 LAB — T4, FREE: Free T4: 1.3 ng/dL (ref 0.8–1.8)

## 2021-01-17 LAB — VITAMIN D 25 HYDROXY (VIT D DEFICIENCY, FRACTURES): Vit D, 25-Hydroxy: 17 ng/mL — ABNORMAL LOW (ref 30–100)

## 2021-02-11 ENCOUNTER — Encounter: Payer: Self-pay | Admitting: Cardiology

## 2021-02-13 ENCOUNTER — Ambulatory Visit: Payer: BC Managed Care – PPO | Admitting: Cardiology

## 2021-02-13 DIAGNOSIS — I2510 Atherosclerotic heart disease of native coronary artery without angina pectoris: Secondary | ICD-10-CM | POA: Diagnosis not present

## 2021-02-13 DIAGNOSIS — K219 Gastro-esophageal reflux disease without esophagitis: Secondary | ICD-10-CM | POA: Diagnosis not present

## 2021-02-13 DIAGNOSIS — E7849 Other hyperlipidemia: Secondary | ICD-10-CM | POA: Diagnosis not present

## 2021-02-13 DIAGNOSIS — I10 Essential (primary) hypertension: Secondary | ICD-10-CM | POA: Diagnosis not present

## 2021-03-03 ENCOUNTER — Ambulatory Visit: Payer: BC Managed Care – PPO | Admitting: Cardiology

## 2021-03-03 DIAGNOSIS — R0683 Snoring: Secondary | ICD-10-CM | POA: Insufficient documentation

## 2021-03-03 NOTE — Progress Notes (Signed)
Cardiology Office Note   Date:  03/04/2021   ID:  Veronica Bowen, DOB [birth year], MRN [id number]  PCP:  Renaye Rakers, MD  Cardiologist:   Rollene Rotunda, MD   Chief Complaint  Patient presents with  . Coronary Artery Disease      History of Present Illness: Veronica Bowen is a 65 y.o. female who presents for follow up of NSTEMI with subsequent PCI of an LAD lesion.   Since I last saw her she is not had any recurrence of the chest discomfort as well.  Myocardial infarction.  She does get some discomfort at night lying down.  She eats late at night and thinks it feels like reflux.  Some burning discomfort with some gas.  She does do some walking and she does not bring on any chest discomfort with that.  She not describing any new shortness of breath, PND or orthopnea.  She does have some occasional numbness in her arm on the right side and her leg on the right side.  She does find a way had some snoring it is loud.  She has daytime somnolence.  There have been some apneic episodes.   Past Medical History:  Diagnosis Date  . Arrhythmia   . Chest pain 06/07/2020  . Hyperlipidemia   . Hypertension   . NSTEMI (non-ST elevated myocardial infarction) (HCC)   . Supraventricular tachycardia (HCC) 06/07/2020  . Urinary tract infection 06/07/2020    Past Surgical History:  Procedure Laterality Date  . CESAREAN SECTION  1989  . CORONARY STENT INTERVENTION N/A 06/11/2020   Procedure: CORONARY STENT INTERVENTION;  Surgeon: Marykay Lex, MD;  Location: [facility] INVASIVE CV LAB;  Service: Cardiovascular;  Laterality: N/A;  . FOOT SURGERY Right    hammer toes  both left and right  . LEFT HEART CATH AND CORONARY ANGIOGRAPHY N/A 06/11/2020   Procedure: LEFT HEART CATH AND CORONARY ANGIOGRAPHY;  Surgeon: Marykay Lex, MD;  Location: [facility] INVASIVE CV LAB;  Service: Cardiovascular;  Laterality: N/A;     Current Outpatient Medications  Medication Sig Dispense Refill  . aspirin 81 MG chewable tablet Chew 1  tablet (81 mg total) by mouth daily. 30 tablet 6  . nitroGLYCERIN (NITROSTAT) 0.4 MG SL tablet Place 1 tablet (0.4 mg total) under the tongue every 5 (five) minutes as needed for chest pain. 25 tablet 12  . atorvastatin (LIPITOR) 40 MG tablet Take 1 tablet (40 mg total) by mouth daily. 90 tablet 3  . losartan (COZAAR) 25 MG tablet Take 1 tablet (25 mg total) by mouth daily. 90 tablet 3  . metoprolol tartrate (LOPRESSOR) 25 MG tablet Take 1 tablet (25 mg total) by mouth 2 (two) times daily. 180 tablet 3   No current facility-administered medications for this visit.    Allergies:   Patient has no known allergies.    ROS:  Please see the history of present illness.   Otherwise, review of systems are positive for none.   All other systems are reviewed and negative.    PHYSICAL EXAM: VS:  BP 136/78 (BP Location: Left Arm, Patient Position: Sitting)   Pulse 81   Ht 5\' 5"  (1.651 m)   Wt 204 lb 3.2 oz (92.6 kg)   SpO2 96%   BMI 33.98 kg/m  , BMI Body mass index is 33.98 kg/m. GENERAL:  Well appearing NECK:  No jugular venous distention, waveform within normal limits, carotid upstroke brisk and symmetric, no bruits, no thyromegaly LUNGS:  Clear to  auscultation bilaterally CHEST:  Unremarkable HEART:  PMI not displaced or sustained,S1 and S2 within normal limits, no S3, no S4, no clicks, no rubs, no murmurs ABD:  Flat, positive bowel sounds normal in frequency in pitch, no bruits, no rebound, no guarding, no midline pulsatile mass, no hepatomegaly, no splenomegaly EXT:  2 plus pulses throughout, no edema, no cyanosis no clubbing  EKG:  EKG is  ordered today. Sinus rhythm, rate 81, low voltage in the limb leads, nonspecific T wave flattening.   Recent Labs: 01/15/2021: ALT 21; BUN 15; Creat 0.96; Hemoglobin 11.6; Platelets 218; Potassium 4.5; Sodium 139; TSH 4.93    Lipid Panel    Component Value Date/Time   CHOL 134 01/15/2021 0000   TRIG 143 01/15/2021 0000   HDL 45 (L) 01/15/2021  0000   CHOLHDL 3.0 01/15/2021 0000   VLDL 15 06/08/2020 0224   LDLCALC 66 01/15/2021 0000      Wt Readings from Last 3 Encounters:  03/04/21 204 lb 3.2 oz (92.6 kg)  11/15/20 202 lb 12.8 oz (92 kg)  07/04/20 193 lb 6.4 oz (87.7 kg)    Diagnostic Dominance: Right    Intervention       Other studies Reviewed: Additional studies/ records that were reviewed today include: Labs. Review of the above records demonstrates:  Please see elsewhere in the note.     ASSESSMENT AND PLAN:  CAD:   The patient has no new sypmtoms.  No further cardiovascular testing is indicated.  We will continue with aggressive risk reduction.  She can stop her Brilinta  CHEST PAIN:  This sounds non anginal.  It sounds like reflux most likely.  We are going to try over-the-counter medications, we talked about raising the head of the bed and not eating late at night.  We talked about foods that might exacerbate reflux.  If she does all of this and continues to have symptoms she will let me know and I might consider further testing.  SVT:  She has none of this.    HTN: Her blood pressure is at target.  No change in therapy.   DYSLIPIDEMIA:    Her LDL was 65.  No change in therapy.    SNORING:    Patient has snoring, witnessed apnea and Daytime somnolence we are going to screen her with a sleep study.  Current medicines are reviewed at length with the patient today.  The patient does not have concerns regarding medicines.  The following changes have been made: As above  Labs/ tests ordered today include:   Orders Placed This Encounter  Procedures  . EKG 12-Lead  . Split night study     Disposition:   FU with me in one year.    Signed, Rollene Rotunda, MD  03/04/2021 10:12 AM    [facility]

## 2021-03-04 ENCOUNTER — Other Ambulatory Visit: Payer: Self-pay

## 2021-03-04 ENCOUNTER — Encounter: Payer: Self-pay | Admitting: Cardiology

## 2021-03-04 ENCOUNTER — Ambulatory Visit (INDEPENDENT_AMBULATORY_CARE_PROVIDER_SITE_OTHER): Payer: BC Managed Care – PPO | Admitting: Cardiology

## 2021-03-04 VITALS — BP 136/78 | HR 81 | Ht 65.0 in | Wt 204.2 lb

## 2021-03-04 DIAGNOSIS — R0683 Snoring: Secondary | ICD-10-CM

## 2021-03-04 DIAGNOSIS — R400 Somnolence: Secondary | ICD-10-CM

## 2021-03-04 DIAGNOSIS — E785 Hyperlipidemia, unspecified: Secondary | ICD-10-CM | POA: Diagnosis not present

## 2021-03-04 DIAGNOSIS — I471 Supraventricular tachycardia: Secondary | ICD-10-CM

## 2021-03-04 DIAGNOSIS — I2510 Atherosclerotic heart disease of native coronary artery without angina pectoris: Secondary | ICD-10-CM | POA: Diagnosis not present

## 2021-03-04 DIAGNOSIS — I10 Essential (primary) hypertension: Secondary | ICD-10-CM

## 2021-03-04 MED ORDER — ATORVASTATIN CALCIUM 40 MG PO TABS: 40.0000 mg | ORAL_TABLET | Freq: Every day | ORAL | 3 refills | Status: AC

## 2021-03-04 MED ORDER — LOSARTAN POTASSIUM 25 MG PO TABS: 25.0000 mg | ORAL_TABLET | Freq: Every day | ORAL | 3 refills | Status: AC

## 2021-03-04 MED ORDER — METOPROLOL TARTRATE 25 MG PO TABS: 25.0000 mg | ORAL_TABLET | Freq: Two times a day (BID) | ORAL | 3 refills | Status: DC

## 2021-03-04 NOTE — Patient Instructions (Signed)
Medication Instructions:  STOP- Brilinta  *If you need a refill on your cardiac medications before your next appointment, please call your pharmacy*   Lab Work: None Ordered   Testing/Procedures: Your physician has recommended that you have a sleep study. This test records several body functions during sleep, including: brain activity, eye movement, oxygen and carbon dioxide blood levels, heart rate and rhythm, breathing rate and rhythm, the flow of air through your mouth and nose, snoring, body muscle movements, and chest and belly movement.   Follow-Up: At [facility], you and your health needs are our priority.  As part of our continuing mission to provide you with exceptional heart care, we have created designated Provider Care Teams.  These Care Teams include your primary Cardiologist (physician) and Advanced Practice Providers (APPs -  Physician Assistants and Nurse Practitioners) who all work together to provide you with the care you need, when you need it.  We recommend signing up for the patient portal called "MyChart".  Sign up information is provided on this After Visit Summary.  MyChart is used to connect with patients for Virtual Visits (Telemedicine).  Patients are able to view lab/test results, encounter notes, upcoming appointments, etc.  Non-urgent messages can be sent to your provider as well.   To learn more about what you can do with MyChart, go to [email or web address].    Your next appointment:   1 year(s)  The format for your next appointment:   In Person  Provider:   You may see Rollene Rotunda, MD or one of the following Advanced Practice Providers on your designated Care Team:    Theodore Demark, PA-C  Joni Reining, DNP, ANP

## 2021-03-06 ENCOUNTER — Telehealth: Payer: Self-pay | Admitting: *Deleted

## 2021-03-06 NOTE — Telephone Encounter (Signed)
Left sleep study appointment details on phone VM.

## 2021-04-11 DIAGNOSIS — E7849 Other hyperlipidemia: Secondary | ICD-10-CM | POA: Diagnosis not present

## 2021-04-11 DIAGNOSIS — I2510 Atherosclerotic heart disease of native coronary artery without angina pectoris: Secondary | ICD-10-CM | POA: Diagnosis not present

## 2021-04-11 DIAGNOSIS — E669 Obesity, unspecified: Secondary | ICD-10-CM | POA: Diagnosis not present

## 2021-04-11 DIAGNOSIS — I10 Essential (primary) hypertension: Secondary | ICD-10-CM | POA: Diagnosis not present

## 2021-04-11 DIAGNOSIS — K219 Gastro-esophageal reflux disease without esophagitis: Secondary | ICD-10-CM | POA: Diagnosis not present

## 2021-04-11 DIAGNOSIS — Z6832 Body mass index (BMI) 32.0-32.9, adult: Secondary | ICD-10-CM | POA: Diagnosis not present

## 2021-04-14 ENCOUNTER — Other Ambulatory Visit: Payer: Self-pay | Admitting: Internal Medicine

## 2021-04-14 DIAGNOSIS — E2839 Other primary ovarian failure: Secondary | ICD-10-CM

## 2021-04-28 ENCOUNTER — Other Ambulatory Visit: Payer: Self-pay | Admitting: Internal Medicine

## 2021-04-28 DIAGNOSIS — J209 Acute bronchitis, unspecified: Secondary | ICD-10-CM | POA: Diagnosis not present

## 2021-04-28 DIAGNOSIS — Z6832 Body mass index (BMI) 32.0-32.9, adult: Secondary | ICD-10-CM | POA: Diagnosis not present

## 2021-04-28 DIAGNOSIS — E669 Obesity, unspecified: Secondary | ICD-10-CM | POA: Diagnosis not present

## 2021-04-28 DIAGNOSIS — Z20822 Contact with and (suspected) exposure to covid-19: Secondary | ICD-10-CM | POA: Diagnosis not present

## 2021-04-28 DIAGNOSIS — Z20828 Contact with and (suspected) exposure to other viral communicable diseases: Secondary | ICD-10-CM | POA: Diagnosis not present

## 2021-04-28 DIAGNOSIS — E7849 Other hyperlipidemia: Secondary | ICD-10-CM | POA: Diagnosis not present

## 2021-04-28 DIAGNOSIS — I10 Essential (primary) hypertension: Secondary | ICD-10-CM | POA: Diagnosis not present

## 2021-04-29 LAB — SARS-COV-2 RNA,(COVID-19) QUALITATIVE NAAT: SARS CoV2 RNA: NOT DETECTED

## 2021-05-02 ENCOUNTER — Ambulatory Visit (HOSPITAL_BASED_OUTPATIENT_CLINIC_OR_DEPARTMENT_OTHER): Payer: BC Managed Care – PPO | Attending: Cardiology | Admitting: Cardiovascular Disease

## 2021-05-02 ENCOUNTER — Other Ambulatory Visit: Payer: Self-pay

## 2021-05-02 DIAGNOSIS — R0902 Hypoxemia: Secondary | ICD-10-CM | POA: Insufficient documentation

## 2021-05-02 DIAGNOSIS — R0683 Snoring: Secondary | ICD-10-CM

## 2021-05-02 DIAGNOSIS — G4733 Obstructive sleep apnea (adult) (pediatric): Secondary | ICD-10-CM

## 2021-05-02 DIAGNOSIS — G4736 Sleep related hypoventilation in conditions classified elsewhere: Secondary | ICD-10-CM | POA: Diagnosis not present

## 2021-05-06 ENCOUNTER — Other Ambulatory Visit: Payer: Self-pay

## 2021-05-06 ENCOUNTER — Other Ambulatory Visit (HOSPITAL_BASED_OUTPATIENT_CLINIC_OR_DEPARTMENT_OTHER): Payer: Self-pay

## 2021-05-06 DIAGNOSIS — R0683 Snoring: Secondary | ICD-10-CM

## 2021-05-06 DIAGNOSIS — R400 Somnolence: Secondary | ICD-10-CM

## 2021-05-08 ENCOUNTER — Ambulatory Visit (INDEPENDENT_AMBULATORY_CARE_PROVIDER_SITE_OTHER): Payer: BC Managed Care – PPO | Admitting: Obstetrics

## 2021-05-08 ENCOUNTER — Encounter: Payer: Self-pay | Admitting: Obstetrics

## 2021-05-08 ENCOUNTER — Other Ambulatory Visit: Payer: Self-pay

## 2021-05-08 ENCOUNTER — Other Ambulatory Visit (HOSPITAL_COMMUNITY)
Admission: RE | Admit: 2021-05-08 | Discharge: 2021-05-08 | Disposition: A | Discharge status: Home / Self Care | Payer: BC Managed Care – PPO | Source: Ambulatory Visit | Attending: Obstetrics | Admitting: Obstetrics

## 2021-05-08 VITALS — BP 128/79 | HR 81 | Ht 65.0 in | Wt 201.8 lb

## 2021-05-08 DIAGNOSIS — N898 Other specified noninflammatory disorders of vagina: Secondary | ICD-10-CM | POA: Insufficient documentation

## 2021-05-08 DIAGNOSIS — Z780 Asymptomatic menopausal state: Secondary | ICD-10-CM

## 2021-05-08 DIAGNOSIS — J301 Allergic rhinitis due to pollen: Secondary | ICD-10-CM

## 2021-05-08 DIAGNOSIS — K219 Gastro-esophageal reflux disease without esophagitis: Secondary | ICD-10-CM | POA: Diagnosis not present

## 2021-05-08 DIAGNOSIS — Z01419 Encounter for gynecological examination (general) (routine) without abnormal findings: Secondary | ICD-10-CM | POA: Diagnosis not present

## 2021-05-08 DIAGNOSIS — E669 Obesity, unspecified: Secondary | ICD-10-CM

## 2021-05-08 DIAGNOSIS — R52 Pain, unspecified: Secondary | ICD-10-CM

## 2021-05-08 MED ORDER — LORATADINE 10 MG PO TABS: 10.0000 mg | ORAL_TABLET | Freq: Every day | ORAL | 11 refills | Status: DC

## 2021-05-08 MED ORDER — IBUPROFEN 800 MG PO TABS: 800.0000 mg | ORAL_TABLET | Freq: Three times a day (TID) | ORAL | 5 refills | Status: DC | PRN

## 2021-05-08 MED ORDER — FAMOTIDINE 40 MG PO TABS: 40.0000 mg | ORAL_TABLET | Freq: Every day | ORAL | 11 refills | Status: AC

## 2021-05-08 NOTE — Progress Notes (Signed)
Subjective:        Veronica Bowen is a 65 y.o. female here for a routine exam.  Current complaints: Vaginal discharge.    Personal health questionnaire:  Is patient Ashkenazi Jewish, have a family history of breast and/or ovarian cancer: no Is there a family history of uterine cancer diagnosed at age < 29, gastrointestinal cancer, urinary tract cancer, family member who is a Personnel officer syndrome-associated carrier: no Is the patient overweight and hypertensive, family history of diabetes, personal history of gestational diabetes, preeclampsia or PCOS: no Is patient over 58, have PCOS,  family history of premature CHD under age 2, diabetes, smoke, have hypertension or peripheral artery disease:  no At any time, has a partner hit, kicked or otherwise hurt or frightened you?: no Over the past 2 weeks, have you felt down, depressed or hopeless?: no Over the past 2 weeks, have you felt little interest or pleasure in doing things?:no   Gynecologic History No LMP recorded. Patient is postmenopausal. Contraception: post menopausal status Last Pap: unknown. Results were: normal Last mammogram: unknown. Results were: normal  Obstetric History OB History  Gravida Para Term Preterm AB Living  3       1 2   SAB IAB Ectopic Multiple Live Births    1     2    # Outcome Date GA Lbr Len/2nd Weight Sex Delivery Anes PTL Lv  3 Gravida           2 Gravida           1 IAB             Past Medical History:  Diagnosis Date  . Arrhythmia   . Chest pain 06/07/2020  . Hyperlipidemia   . Hypertension   . NSTEMI (non-ST elevated myocardial infarction) (HCC)   . Supraventricular tachycardia (HCC) 06/07/2020  . Urinary tract infection 06/07/2020    Past Surgical History:  Procedure Laterality Date  . CESAREAN SECTION  1989  . CORONARY STENT INTERVENTION N/A 06/11/2020   Procedure: CORONARY STENT INTERVENTION;  Surgeon: 08/12/2020, MD;  Location[facility] INVASIVE CV LAB;  Service: Cardiovascular;   Laterality: N/A;  . FOOT SURGERY Right    hammer toes  both left and right  . LEFT HEART CATH AND CORONARY ANGIOGRAPHY N/A 06/11/2020   Procedure: LEFT HEART CATH AND CORONARY ANGIOGRAPHY;  Surgeon: 08/12/2020, MD;  Location: [facility] INVASIVE CV LAB;  Service: Cardiovascular;  Laterality: N/A;     Current Outpatient Medications:  .  aspirin 81 MG chewable tablet, Chew 1 tablet (81 mg total) by mouth daily., Disp: 30 tablet, Rfl: 6 .  atorvastatin (LIPITOR) 40 MG tablet, Take 1 tablet (40 mg total) by mouth daily., Disp: 90 tablet, Rfl: 3 .  losartan (COZAAR) 25 MG tablet, Take 1 tablet (25 mg total) by mouth daily., Disp: 90 tablet, Rfl: 3 .  metoprolol tartrate (LOPRESSOR) 25 MG tablet, Take 1 tablet (25 mg total) by mouth 2 (two) times daily., Disp: 180 tablet, Rfl: 3 .  nitroGLYCERIN (NITROSTAT) 0.4 MG SL tablet, Place 1 tablet (0.4 mg total) under the tongue every 5 (five) minutes as needed for chest pain., Disp: 25 tablet, Rfl: 12 .  Vitamin D, Ergocalciferol, (DRISDOL) 1.25 MG (50000 UNIT) CAPS capsule, Take 1 capsule by mouth once a week., Disp: , Rfl:  No Known Allergies  Social History   Tobacco Use  . Smoking status: Never Smoker  . Smokeless tobacco: Never Used  Substance Use Topics  .  Alcohol use: Yes    Alcohol/week: 1.0 standard drink    Types: 1 Glasses of wine per week    Comment: occ wine    Family History  Problem Relation Age of Onset  . Diabetes Mother   . Diabetes Father   . Diabetes Sister       Review of Systems  Constitutional: negative for fatigue and weight loss Respiratory: negative for cough and wheezing Cardiovascular: negative for chest pain, fatigue and palpitations Gastrointestinal: negative for abdominal pain and change in bowel habits Musculoskeletal:negative for myalgias Neurological: negative for gait problems and tremors Behavioral/Psych: negative for abusive relationship, depression Endocrine: negative for temperature intolerance     Genitourinary: positive for vaginal discharge.  negative for abnormal menstrual periods, genital lesions, hot flashes, sexual problems Integument/breast: negative for breast lump, breast tenderness, nipple discharge and skin lesion(s)    Objective:       BP 128/79   Pulse 81   Ht 5\' 5"  (1.651 m)   Wt 201 lb 12.8 oz (91.5 kg)   BMI 33.58 kg/m  General:   alert and no distress  Skin:   no rash or abnormalities  Lungs:   clear to auscultation bilaterally  Heart:   regular rate and rhythm, S1, S2 normal, no murmur, click, rub or gallop  Breasts:   normal without suspicious masses, skin or nipple changes or axillary nodes  Abdomen:  normal findings: no organomegaly, soft, non-tender and no hernia  Pelvis:  External genitalia: normal general appearance Urinary system: urethral meatus normal and bladder without fullness, nontender Vaginal: normal without tenderness, induration or masses Cervix: normal appearance Adnexa: normal bimanual exam Uterus: anteverted and non-tender, normal size   Lab Review Urine pregnancy test Labs reviewed yes Radiologic studies reviewed yes  I have spent a total of 20 minutes of face-to-face time, excluding clinical staff time, reviewing notes and preparing to see patient, ordering tests and/or medications, and counseling the patient.   Assessment:     1. Encounter for gynecological examination with Papanicolaou smear of cervix Rx: - Cytology - PAP( [place])  2. Postmenopause  3. Vaginal discharge Rx: - Cervicovaginal ancillary only  4. Gastroesophageal reflux disease without esophagitis Rx: - famotidine (PEPCID) 40 MG tablet; Take 1 tablet (40 mg total) by mouth daily.  Dispense: 30 tablet; Refill: 11  5. Pain Rx: - ibuprofen (ADVIL) 800 MG tablet; Take 1 tablet (800 mg total) by mouth every 8 (eight) hours as needed.  Dispense: 30 tablet; Refill: 5  6. Seasonal allergic rhinitis due to pollen Rx: - loratadine (CLARITIN) 10 MG  tablet; Take 1 tablet (10 mg total) by mouth daily.  Dispense: 30 tablet; Refill: 11  7. Obesity (BMI 35.0-39.9 without comorbidity) - weight loss with the aid of caloric reduction, exercise and behavioral modification recommended    Plan:    Education reviewed: calcium supplements, depression evaluation, low fat, low cholesterol diet, safe sex/STD prevention, self breast exams and weight bearing exercise. Mammogram ordered. Follow up in: 1 year.     11-06[birth year]1990, MD 05/08/2021 8:44 AM

## 2021-05-08 NOTE — Progress Notes (Signed)
NGYN pt presents for annual and pap. Pt declines STD's. Mammogram will schedule Bone density - unsure per pt  Colonoscopy - UTD

## 2021-05-09 LAB — CERVICOVAGINAL ANCILLARY ONLY
Bacterial Vaginitis (gardnerella): POSITIVE — AB
Candida Glabrata: NEGATIVE
Candida Vaginitis: NEGATIVE
Chlamydia: NEGATIVE
Comment: NEGATIVE
Comment: NEGATIVE
Comment: NEGATIVE
Comment: NEGATIVE
Comment: NEGATIVE
Comment: NORMAL
Neisseria Gonorrhea: NEGATIVE
Trichomonas: NEGATIVE

## 2021-05-10 ENCOUNTER — Other Ambulatory Visit: Payer: Self-pay | Admitting: Obstetrics

## 2021-05-10 DIAGNOSIS — N760 Acute vaginitis: Secondary | ICD-10-CM

## 2021-05-10 DIAGNOSIS — B9689 Other specified bacterial agents as the cause of diseases classified elsewhere: Secondary | ICD-10-CM

## 2021-05-10 MED ORDER — METRONIDAZOLE 500 MG PO TABS
500.0000 mg | ORAL_TABLET | Freq: Two times a day (BID) | ORAL | 2 refills | Status: DC
  Filled 2021-05-10: qty 14, 7d supply, fill #0

## 2021-05-12 ENCOUNTER — Other Ambulatory Visit (HOSPITAL_COMMUNITY): Payer: Self-pay

## 2021-05-14 ENCOUNTER — Other Ambulatory Visit: Payer: Self-pay

## 2021-05-14 ENCOUNTER — Ambulatory Visit
Admission: RE | Admit: 2021-05-14 | Discharge: 2021-05-14 | Disposition: A | Discharge status: Home / Self Care | Payer: BC Managed Care – PPO | Source: Ambulatory Visit | Attending: Internal Medicine | Admitting: Internal Medicine

## 2021-05-14 DIAGNOSIS — E2839 Other primary ovarian failure: Secondary | ICD-10-CM

## 2021-05-14 DIAGNOSIS — Z780 Asymptomatic menopausal state: Secondary | ICD-10-CM | POA: Diagnosis not present

## 2021-05-14 LAB — CYTOLOGY - PAP
Comment: NEGATIVE
Comment: NEGATIVE
Diagnosis: NEGATIVE
HPV 16: NEGATIVE
HPV 18 / 45: NEGATIVE
High risk HPV: POSITIVE — AB

## 2021-05-15 ENCOUNTER — Other Ambulatory Visit: Payer: Self-pay | Admitting: Family Medicine

## 2021-05-15 ENCOUNTER — Telehealth: Payer: Self-pay

## 2021-05-15 DIAGNOSIS — Z1231 Encounter for screening mammogram for malignant neoplasm of breast: Secondary | ICD-10-CM

## 2021-05-15 NOTE — Telephone Encounter (Signed)
TC to pt to make aware of results and to repeat pap in 1 yr  Pt not ava  LVM

## 2021-05-15 NOTE — Telephone Encounter (Signed)
-----   Message from Brock Bad, MD sent at 05/14/2021  4:50 PM EDT ----- NILM pap with positive HRHPV.  Repeat pap in 1 year

## 2021-05-15 NOTE — Telephone Encounter (Signed)
Attempt to call pt regarding pap results  No answer  lvm

## 2021-05-15 NOTE — Telephone Encounter (Signed)
-----   Message from Brock Bad, MD sent at 05/10/2021  6:53 AM EDT ----- Flagyl Rx for BV

## 2021-05-16 DIAGNOSIS — E7849 Other hyperlipidemia: Secondary | ICD-10-CM | POA: Diagnosis not present

## 2021-05-16 DIAGNOSIS — E669 Obesity, unspecified: Secondary | ICD-10-CM | POA: Diagnosis not present

## 2021-05-16 DIAGNOSIS — K219 Gastro-esophageal reflux disease without esophagitis: Secondary | ICD-10-CM | POA: Diagnosis not present

## 2021-05-16 DIAGNOSIS — J302 Other seasonal allergic rhinitis: Secondary | ICD-10-CM | POA: Diagnosis not present

## 2021-05-16 DIAGNOSIS — I10 Essential (primary) hypertension: Secondary | ICD-10-CM | POA: Diagnosis not present

## 2021-05-16 DIAGNOSIS — Z6833 Body mass index (BMI) 33.0-33.9, adult: Secondary | ICD-10-CM | POA: Diagnosis not present

## 2021-05-25 ENCOUNTER — Encounter (HOSPITAL_BASED_OUTPATIENT_CLINIC_OR_DEPARTMENT_OTHER): Payer: Self-pay | Admitting: Cardiovascular Disease

## 2021-05-25 NOTE — Procedures (Signed)
Patient Name: Veronica Bowen, Veronica Bowen Date: 05/02/2021 Gender: Female D.O.B: 1956/01/20 Age (years): 64 Referring Provider: Rollene Rotunda Height (inches): 65 Interpreting Physician: Nicki Guadalajara MD, ABSM Weight (lbs): 190 RPSGT: Cherylann Parr BMI: 32 MRN: [id number] Neck Size: 15.00  CLINICAL INFORMATION Sleep Study Type: NPSG  Indication for sleep study: Snoring, Witnesses Apnea / Gasping During Sleep  Epworth Sleepiness Score: 6  SLEEP STUDY TECHNIQUE As per the AASM Manual for the Scoring of Sleep and Associated Events v2.3 (April 2016) with a hypopnea requiring 4% desaturations.  The channels recorded and monitored were frontal, central and occipital EEG, electrooculogram (EOG), submentalis EMG (chin), nasal and oral airflow, thoracic and abdominal wall motion, anterior tibialis EMG, snore microphone, electrocardiogram, and pulse oximetry.  MEDICATIONS aspirin 81 MG chewable tablet atorvastatin (LIPITOR) 40 MG tablet famotidine (PEPCID) 40 MG tablet ibuprofen (ADVIL) 800 MG tablet loratadine (CLARITIN) 10 MG tablet losartan (COZAAR) 25 MG tablet metoprolol tartrate (LOPRESSOR) 25 MG tablet metroNIDAZOLE (FLAGYL) 500 MG tablet nitroGLYCERIN (NITROSTAT) 0.4 MG SL tablet Vitamin D, Ergocalciferol, (DRISDOL) 1.25 MG (50000 UNIT) CAPS capsule Medications self-administered by patient taken the night of the study : METOPROLOL  SLEEP ARCHITECTURE The study was initiated at 10:27:34 PM and ended at 5:10:31 AM.  Sleep onset time was 76.3 minutes and the sleep efficiency was 68.8%%. The total sleep time was 277.1 minutes.  Stage REM latency was 126.0 minutes.  The patient spent 3.2%% of the night in stage N1 sleep, 76.9%% in stage N2 sleep, 0.0%% in stage N3 and 19.9% in REM.  Alpha intrusion was absent.  Supine sleep was 60.49%.  RESPIRATORY PARAMETERS The overall apnea/hypopnea index (AHI) was 7.1 per hour. The respiratory disturbance index (RDI) was 9.5/h.  There were 5 total apneas, including 5 obstructive, 0 central and 0 mixed apneas. There were 28 hypopneas and 11 RERAs.  The AHI during Stage REM sleep was 14.2 per hour.  AHI while supine was 11.5 per hour.  The mean oxygen saturation was 94.7%. The minimum SpO2 during sleep was 84.0%.  Moderate snoring was noted during this study.  CARDIAC DATA The 2 lead EKG demonstrated sinus rhythm. The mean heart rate was 56.9 beats per minute. Other EKG findings include: None.  LEG MOVEMENT DATA The total PLMS were 0 with a resulting PLMS index of 0.0. Associated arousal with leg movement index was 0.0 .  IMPRESSIONS - Mild obstructive sleep apnea overall  (AHI 7.1/h, RDI 9.5/h); however, events were worse with supine sleep (AHI 11.5/h) and during REM sleep (AHI 14.2/h) - Mild oxygen desaturation to a nadir 84.0%. - The patient snored with moderate snoring volume. - No cardiac abnormalities were noted during this study. - Clinically significant periodic limb movements did not occur during sleep. No significant associated arousals.  DIAGNOSIS - Obstructive Sleep Apnea (G47.33) - Nocturnal Hypoxemia (G47.36)  RECOMMENDATIONS - In this symptomatic patient with significant cardiovascular co-morbidities, recommend CPAP initiation. Can consider Auto-PAP with EPR of 3 at 6 - 16 cm of water.  - Effort should be made to optimize nasal and oropharyngeal patency. - Positional therapy avoiding supine position during sleep.. - Avoid alcohol, sedatives and other CNS depressants that may worsen sleep apnea and disrupt normal sleep architecture. - Sleep hygiene should be reviewed to assess factors that may improve sleep quality. - Weight management (BMI 32) and regular exercise should be initiated or continued if appropriate.  [Electronically signed] 05/25/2021 03:56 PM  Nicki Guadalajara MD, [facility], ABSM Diplomate, American Board of Sleep Medicine  NPI: [id number] [facility] PH:  [phone]   FX: [phone] ACCREDITED BY THE AMERICAN ACADEMY OF SLEEP MEDICINE

## 2021-06-02 DIAGNOSIS — Z1212 Encounter for screening for malignant neoplasm of rectum: Secondary | ICD-10-CM | POA: Diagnosis not present

## 2021-06-02 DIAGNOSIS — Z1211 Encounter for screening for malignant neoplasm of colon: Secondary | ICD-10-CM | POA: Diagnosis not present

## 2021-06-18 ENCOUNTER — Other Ambulatory Visit: Payer: Self-pay | Admitting: Obstetrics

## 2021-06-18 DIAGNOSIS — K219 Gastro-esophageal reflux disease without esophagitis: Secondary | ICD-10-CM

## 2021-06-23 NOTE — Telephone Encounter (Signed)
Please review for refill.  

## 2021-07-07 ENCOUNTER — Telehealth: Payer: Self-pay | Admitting: *Deleted

## 2021-07-07 NOTE — Telephone Encounter (Signed)
Called patient to discuss her sleep study results and recommendations. No answer or answering machine at the number listed in the chart. Will attempt again at a later date.

## 2021-07-09 ENCOUNTER — Encounter: Payer: Self-pay | Admitting: *Deleted

## 2021-07-09 ENCOUNTER — Telehealth: Payer: Self-pay | Admitting: *Deleted

## 2021-07-09 NOTE — Telephone Encounter (Signed)
Attempted to call the patient to discuss sleep study results and recommendations. No answer or machine. Note will be sent to patient. She does not have access to MyChart.

## 2021-07-10 ENCOUNTER — Other Ambulatory Visit: Payer: Self-pay

## 2021-07-10 ENCOUNTER — Ambulatory Visit
Admission: RE | Admit: 2021-07-10 | Discharge: 2021-07-10 | Disposition: A | Discharge status: Home / Self Care | Payer: 59 | Source: Ambulatory Visit | Attending: Family Medicine | Admitting: Family Medicine

## 2021-07-10 DIAGNOSIS — Z1231 Encounter for screening mammogram for malignant neoplasm of breast: Secondary | ICD-10-CM

## 2021-07-17 ENCOUNTER — Telehealth: Payer: Self-pay | Admitting: *Deleted

## 2021-07-17 NOTE — Telephone Encounter (Signed)
After receiving the letter that was sent to her by me, patient called in and was given her sleep study results and recommendations. She states that she does not want to sleep with "that thing" every night. After I went over the health risks of OSA and the benefits of treating it, she decided to give it a try. I informed her that it will probably be anywhere from 4 to 6 months before she gets the machine anyway due to the [place] wide back order. Patient verbalized her concerns of having all of these tests ordered causing her to have a lot of bills. She prefers to skip the titration study and do APAP due to $$. She also states that if her insurances will not cover the machine 100% then most likely she won't get it. APAP order sent to [facility].

## 2021-08-19 ENCOUNTER — Other Ambulatory Visit: Payer: Self-pay | Admitting: Obstetrics

## 2021-08-19 DIAGNOSIS — J301 Allergic rhinitis due to pollen: Secondary | ICD-10-CM

## 2021-09-08 ENCOUNTER — Other Ambulatory Visit: Payer: Self-pay

## 2021-09-08 ENCOUNTER — Encounter (HOSPITAL_BASED_OUTPATIENT_CLINIC_OR_DEPARTMENT_OTHER): Payer: Self-pay | Admitting: Family

## 2021-09-08 ENCOUNTER — Ambulatory Visit (HOSPITAL_BASED_OUTPATIENT_CLINIC_OR_DEPARTMENT_OTHER): Payer: 59 | Admitting: Family

## 2021-09-08 VITALS — BP 116/68 | HR 65 | Ht 65.0 in | Wt 204.0 lb

## 2021-09-08 DIAGNOSIS — K219 Gastro-esophageal reflux disease without esophagitis: Secondary | ICD-10-CM | POA: Diagnosis not present

## 2021-09-08 DIAGNOSIS — I471 Supraventricular tachycardia: Secondary | ICD-10-CM

## 2021-09-08 DIAGNOSIS — E785 Hyperlipidemia, unspecified: Secondary | ICD-10-CM

## 2021-09-08 DIAGNOSIS — I25118 Atherosclerotic heart disease of native coronary artery with other forms of angina pectoris: Secondary | ICD-10-CM | POA: Diagnosis not present

## 2021-09-08 MED ORDER — PANTOPRAZOLE SODIUM 20 MG PO TBEC: 20.0000 mg | DELAYED_RELEASE_TABLET | Freq: Every day | ORAL | 5 refills | Status: DC

## 2021-09-08 NOTE — Patient Instructions (Addendum)
Medication Instructions:  Your physician has recommended you make the following change in your medication:   START Pantoprazole (Protonix) one 20mg  tablet daily   CONTINUE Famotidine (Pepcid) one 40mg  tablet daily  If your symptoms of indigestion and acid reflux continue recommend talking to your primary care.  If you have recurrent chest pain, please call our office. At that time we would consider a stress test. Your symptoms were more likely due to indigestion and acid reflux.   *If you need a refill on your cardiac medications before your next appointment, please call your pharmacy*  Lab Work: None ordered today.   Testing/Procedures: Your EKG today shows normal sinus rhythm and was stable compared to previous.   Follow-Up: At [facility], you and your health needs are our priority.  As part of our continuing mission to provide you with exceptional heart care, we have created designated Provider Care Teams.  These Care Teams include your primary Cardiologist (physician) and Advanced Practice Providers (APPs -  Physician Assistants and Nurse Practitioners) who all work together to provide you with the care you need, when you need it.  We recommend signing up for the patient portal called "MyChart".  Sign up information is provided on this After Visit Summary.  MyChart is used to connect with patients for Virtual Visits (Telemedicine).  Patients are able to view lab/test results, encounter notes, upcoming appointments, etc.  Non-urgent messages can be sent to your provider as well.   To learn more about what you can do with MyChart, go to .    Your next appointment:   6 month(s)  The format for your next appointment:   In Person  Provider:   You may see [facility], MD or one of the following Advanced Practice Providers on your designated Care Team:   [email or web address], PA-C Rollene Rotunda, PA-C Theodore Demark, DNP, ANP   Other  Instructions  Heart Healthy Diet Recommendations: A low-salt diet is recommended. Meats should be grilled, baked, or boiled. Avoid fried foods. Focus on lean protein sources like fish or chicken with vegetables and fruits. The American Heart Association is a Juanda Crumble!    Exercise recommendations: The American Heart Association recommends 150 minutes of moderate intensity exercise weekly. Try 30 minutes of moderate intensity exercise 4-5 times per week. This could include walking, jogging, or swimming.    Gastroesophageal Reflux Disease, Adult Gastroesophageal reflux (GER) happens when acid from the stomach flows up into the tube that connects the mouth and the stomach (esophagus). Normally, food travels down the esophagus and stays in the stomach to be digested. With GER, food and stomach acid sometimes move back up into the esophagus. You may have a disease called gastroesophageal reflux disease (GERD) if the reflux: Happens often. Causes frequent or very bad symptoms. Causes problems such as damage to the esophagus. When this happens, the esophagus becomes sore and swollen. Over time, GERD can make small holes (ulcers) in the lining of the esophagus. What are the causes? This condition is caused by a problem with the muscle between the esophagus and the stomach. When this muscle is weak or not normal, it does not close properly to keep food and acid from coming back up from the stomach. The muscle can be weak because of: Tobacco use. Pregnancy. Having a certain type of hernia (hiatal hernia). Alcohol use. Certain foods and drinks, such as coffee, chocolate, onions, and peppermint. What increases the risk? Being overweight. Having a disease that affects your connective  tissue. Taking NSAIDs, such a ibuprofen. What are the signs or symptoms? Heartburn. Difficult or painful swallowing. The feeling of having a lump in the throat. A bitter taste in the mouth. Bad breath. Having  a lot of saliva. Having an upset or bloated stomach. Burping. Chest pain. Different conditions can cause chest pain. Make sure you see your doctor if you have chest pain. Shortness of breath or wheezing. A long-term cough or a cough at night. Wearing away of the surface of teeth (tooth enamel). Weight loss. How is this treated? Making changes to your diet. Taking medicine. Having surgery. Treatment will depend on how bad your symptoms are. Follow these instructions at home: Eating and drinking  Follow a diet as told by your doctor. You may need to avoid foods and drinks such as: Coffee and tea, with or without caffeine. Drinks that contain alcohol. Energy drinks and sports drinks. Bubbly (carbonated) drinks or sodas. Chocolate and cocoa. Peppermint and mint flavorings. Garlic and onions. Horseradish. Spicy and acidic foods. These include peppers, chili powder, curry powder, vinegar, hot sauces, and BBQ sauce. Citrus fruit juices and citrus fruits, such as oranges, lemons, and limes. Tomato-based foods. These include red sauce, chili, salsa, and pizza with red sauce. Fried and fatty foods. These include donuts, french fries, potato chips, and high-fat dressings. High-fat meats. These include hot dogs, rib eye steak, sausage, ham, and bacon. High-fat dairy items, such as whole milk, butter, and cream cheese. Eat small meals often. Avoid eating large meals. Avoid drinking large amounts of liquid with your meals. Avoid eating meals during the 2-3 hours before bedtime. Avoid lying down right after you eat. Do not exercise right after you eat. Lifestyle  Do not smoke or use any products that contain nicotine or tobacco. If you need help quitting, ask your doctor. Try to lower your stress. If you need help doing this, ask your doctor. If you are overweight, lose an amount of weight that is healthy for you. Ask your doctor about a safe weight loss goal. General instructions Pay  attention to any changes in your symptoms. Take over-the-counter and prescription medicines only as told by your doctor. Do not take aspirin, ibuprofen, or other NSAIDs unless your doctor says it is okay. Wear loose clothes. Do not wear anything tight around your waist. Raise (elevate) the head of your bed about 6 inches (15 cm). You may need to use a wedge to do this. Avoid bending over if this makes your symptoms worse. Keep all follow-up visits. Contact a doctor if: You have new symptoms. You lose weight and you do not know why. You have trouble swallowing or it hurts to swallow. You have wheezing or a cough that keeps happening. You have a hoarse voice. Your symptoms do not get better with treatment. Get help right away if: You have sudden pain in your arms, neck, jaw, teeth, or back. You suddenly feel sweaty, dizzy, or light-headed. You have chest pain or shortness of breath. You vomit and the vomit is green, yellow, or black, or it looks like blood or coffee grounds. You faint. Your poop (stool) is red, bloody, or black. You cannot swallow, drink, or eat. These symptoms may represent a serious problem that is an emergency. Do not wait to see if the symptoms will go away. Get medical help right away. Call your local emergency services (911 in the [place] Do not drive yourself to the hospital. Summary If a person has gastroesophageal reflux disease (GERD), food  and stomach acid move back up into the esophagus and cause symptoms or problems such as damage to the esophagus. Treatment will depend on how bad your symptoms are. Follow a diet as told by your doctor. Take all medicines only as told by your doctor. This information is not intended to replace advice given to you by your health care provider. Make sure you discuss any questions you have with your health care provider. Document Revised: 06/03/2020 Document Reviewed: 06/03/2020 Elsevier Patient Education  2022 [employer].

## 2021-09-08 NOTE — Progress Notes (Signed)
Office Visit    Patient Name: Veronica Bowen Date of Encounter: 09/08/2021  PCP:  Renaye Rakers, MD   [facility]  Cardiologist:  Rollene Rotunda, MD  Advanced Practice Provider:  No care team member to display Electrophysiologist:  None      Chief Complaint    Veronica Bowen is a 65 y.o. female with a hx of CAD s/p NSTEMI with PCI of LAD lesion, HLD, HTN, SVT  presents today for CAD follow up.    Past Medical History    Past Medical History:  Diagnosis Date   Arrhythmia    Chest pain 06/07/2020   Hyperlipidemia    Hypertension    NSTEMI (non-ST elevated myocardial infarction) (HCC)    Supraventricular tachycardia (HCC) 06/07/2020   Urinary tract infection 06/07/2020   Past Surgical History:  Procedure Laterality Date   CESAREAN SECTION  1989   CORONARY STENT INTERVENTION N/A 06/11/2020   Procedure: CORONARY STENT INTERVENTION;  Surgeon: Marykay Lex, MD;  Location: [facility] INVASIVE CV LAB;  Service: Cardiovascular;  Laterality: N/A;   FOOT SURGERY Right    hammer toes  both left and right   LEFT HEART CATH AND CORONARY ANGIOGRAPHY N/A 06/11/2020   Procedure: LEFT HEART CATH AND CORONARY ANGIOGRAPHY;  Surgeon: Marykay Lex, MD;  Location: [facility] INVASIVE CV LAB;  Service: Cardiovascular;  Laterality: N/A;    Allergies  No Known Allergies  History of Present Illness    Veronica Bowen is a 65 y.o. female with a hx of CAD s/p NSTEMI with PCI of LAD lesion, HLD, HTN, SVT last seen 03/04/21 by Dr. Antoine Poche.  When last seen she had some chest discomfort described as burning and recommended for Pepcid. She was also recommended for sleep study.   She presents today for follow up. Tells me over the last few days she had gas pain and heartburn which improved with eating. Uncertain what triggered. The pain was moreso in her back and non exertional.She does take Pepcid one 40mg  tablet daily. Reports no shortness of breath nor dyspnea on exertion. Reports no chest pain,  pressure, or tightness. No edema, orthopnea, PND. Reports no palpitations.  No formal exercise routine and not interested in starting one.   EKGs/Labs/Other Studies Reviewed:   The following studies were reviewed today:  EKG:  EKG is ordered today.  The ekg ordered today demonstrates NSR 65 bpm with low voltage in limb leads.   Recent Labs: 01/15/2021: ALT 21; BUN 15; Creat 0.96; Hemoglobin 11.6; Platelets 218; Potassium 4.5; Sodium 139; TSH 4.93  Recent Lipid Panel    Component Value Date/Time   CHOL 134 01/15/2021 0000   TRIG 143 01/15/2021 0000   HDL 45 (L) 01/15/2021 0000   CHOLHDL 3.0 01/15/2021 0000   VLDL 15 06/08/2020 0224   LDLCALC 66 01/15/2021 0000   Home Medications   Current Meds  Medication Sig   albuterol (VENTOLIN HFA) 108 (90 Base) MCG/ACT inhaler as needed. Wheezing, sob   ASPIRIN LOW DOSE 81 MG EC tablet Take 81 mg by mouth daily.   atorvastatin (LIPITOR) 40 MG tablet Take 1 tablet (40 mg total) by mouth daily.   famotidine (PEPCID) 40 MG tablet Take 1 tablet (40 mg total) by mouth daily.   ibuprofen (ADVIL) 800 MG tablet Take 1 tablet (800 mg total) by mouth every 8 (eight) hours as needed.   loratadine (CLARITIN) 10 MG tablet TAKE 1 TABLET(10 MG) BY MOUTH DAILY   losartan (COZAAR) 25 MG  tablet Take 1 tablet (25 mg total) by mouth daily.   metoprolol succinate (TOPROL-XL) 50 MG 24 hr tablet Take 50 mg by mouth 2 (two) times daily.   nitroGLYCERIN (NITROSTAT) 0.4 MG SL tablet Place 1 tablet (0.4 mg total) under the tongue every 5 (five) minutes as needed for chest pain.   Vitamin D, Ergocalciferol, (DRISDOL) 1.25 MG (50000 UNIT) CAPS capsule Take 1 capsule by mouth once a week.    Review of Systems      All other systems reviewed and are otherwise negative except as noted above.  Physical Exam    VS:  BP 116/68   Pulse 65   Ht 5\' 5"  (1.651 m)   Wt 204 lb (92.5 kg)   SpO2 98%   BMI 33.95 kg/m  , BMI Body mass index is 33.95 kg/m.  Wt Readings from  Last 3 Encounters:  09/08/21 204 lb (92.5 kg)  05/08/21 201 lb 12.8 oz (91.5 kg)  05/02/21 190 lb (86.2 kg)     GEN: Well nourished, overweight, well developed, in no acute distress. HEENT: normal. Neck: Supple, no JVD, carotid bruits, or masses. Cardiac: RRR, no murmurs, rubs, or gallops. No clubbing, cyanosis, edema.  Radials/PT 2+ and equal bilaterally.  Respiratory:  Respirations regular and unlabored, clear to auscultation bilaterally. GI: Soft, nontender, nondistended. MS: No deformity or atrophy. Skin: Warm and dry, no rash. Neuro:  Strength and sensation are intact. Psych: Normal affect.  Assessment & Plan    CAD - Stable with no anginal symptoms. No indication for ischemic evaluation.  GDMT includes aspirin, atorvastatin, metoprolol. Heart healthy diet and regular cardiovascular exercise encouraged.    GERD - Start Protonix 20mg  daily. If no improvement, recommend follow up with primary care and consdieration of referral to GI.   HLD - Continue atorvastatin 40mg  daily.   HTn - BP well controlled. Continue current antihypertensive regimen.    SVT - No recurrence. Continue current dose of Metoprolol.   Disposition: Follow up in 6 month(s) with Dr. 05/04/21 or APP.  Signed, , NP 09/08/2021, 4:00 PM [facility]

## 2021-10-15 ENCOUNTER — Other Ambulatory Visit: Payer: Self-pay

## 2021-10-15 ENCOUNTER — Encounter (HOSPITAL_COMMUNITY): Payer: Self-pay

## 2021-10-15 ENCOUNTER — Ambulatory Visit (HOSPITAL_COMMUNITY)
Admission: EM | Admit: 2021-10-15 | Discharge: 2021-10-15 | Disposition: A | Discharge status: Home / Self Care | Payer: 59 | Attending: Medical Oncology | Admitting: Medical Oncology

## 2021-10-15 DIAGNOSIS — R509 Fever, unspecified: Secondary | ICD-10-CM | POA: Insufficient documentation

## 2021-10-15 DIAGNOSIS — U071 COVID-19: Secondary | ICD-10-CM | POA: Insufficient documentation

## 2021-10-15 DIAGNOSIS — R6889 Other general symptoms and signs: Secondary | ICD-10-CM

## 2021-10-15 MED ORDER — OSELTAMIVIR PHOSPHATE 75 MG PO CAPS: 75.0000 mg | ORAL_CAPSULE | Freq: Two times a day (BID) | ORAL | 0 refills | Status: DC

## 2021-10-15 MED ORDER — FLUTICASONE PROPIONATE 50 MCG/ACT NA SUSP: 2.0000 | Freq: Every day | NASAL | 0 refills | Status: AC

## 2021-10-15 MED ORDER — BENZONATATE 100 MG PO CAPS: 100.0000 mg | ORAL_CAPSULE | Freq: Three times a day (TID) | ORAL | 0 refills | Status: DC

## 2021-10-15 NOTE — ED Provider Notes (Signed)
[facility]    CSN: [id number] Arrival date & time: 10/15/21  1159      History   Chief Complaint Chief Complaint  Patient presents with   Chills    HPI Veronica Bowen is a 65 y.o. female.   HPI  Chills: Pt reports that she has had chills and a runny nose since Monday. Also has had subjective fevers. Sister sick with similar symptoms but known cause. No SOB, wheezing, vomiting. Has tried tylenol for symptoms with some relief.   Past Medical History:  Diagnosis Date   Arrhythmia    Chest pain 06/07/2020   Hyperlipidemia    Hypertension    NSTEMI (non-ST elevated myocardial infarction) (HCC)    Supraventricular tachycardia (HCC) 06/07/2020   Urinary tract infection 06/07/2020    Patient Active Problem List   Diagnosis Date Noted   Snoring 03/03/2021   CAD S/P percutaneous coronary angioplasty 07/04/2020   Hypertension 06/07/2020   Hyperlipidemia 06/07/2020   Urinary tract infection 06/07/2020   Chest pain 06/07/2020   Supraventricular tachycardia (HCC) 06/07/2020   NSTEMI (non-ST elevated myocardial infarction) [facility])     Past Surgical History:  Procedure Laterality Date   CESAREAN SECTION  1989   CORONARY STENT INTERVENTION N/A 06/11/2020   Procedure: CORONARY STENT INTERVENTION;  Surgeon: Marykay Lex, MD;  Location: [facility] LAB;  Service: Cardiovascular;  Laterality: N/A;   FOOT SURGERY Right    hammer toes  both left and right   LEFT HEART CATH AND CORONARY ANGIOGRAPHY N/A 06/11/2020   Procedure: LEFT HEART CATH AND CORONARY ANGIOGRAPHY;  Surgeon: Marykay Lex, MD;  Location: [facility] INVASIVE CV LAB;  Service: Cardiovascular;  Laterality: N/A;    OB History     Gravida  3   Para      Term      Preterm      AB  1   Living  2      SAB      IAB  1   Ectopic      Multiple      Live Births  2            Home Medications    Prior to Admission medications   Medication Sig Start Date End Date Taking? Authorizing Provider   albuterol (VENTOLIN HFA) 108 (90 Base) MCG/ACT inhaler as needed. Wheezing, sob 04/28/21   [provider]  ASPIRIN LOW DOSE 81 MG EC tablet Take 81 mg by mouth daily. 08/19/21   [provider]  atorvastatin (LIPITOR) 40 MG tablet Take 1 tablet (40 mg total) by mouth daily. 03/04/21   Rollene Rotunda, MD  famotidine (PEPCID) 40 MG tablet Take 1 tablet (40 mg total) by mouth daily. 05/08/21   Brock Bad, MD  ibuprofen (ADVIL) 800 MG tablet Take 1 tablet (800 mg total) by mouth every 8 (eight) hours as needed. 05/08/21   Brock Bad, MD  loratadine (CLARITIN) 10 MG tablet TAKE 1 TABLET(10 MG) BY MOUTH DAILY 08/20/21   Brock Bad, MD  losartan (COZAAR) 25 MG tablet Take 1 tablet (25 mg total) by mouth daily. 03/04/21 09/08/21  Rollene Rotunda, MD  metoprolol succinate (TOPROL-XL) 50 MG 24 hr tablet Take 50 mg by mouth 2 (two) times daily. 06/18/21   [provider]  nitroGLYCERIN (NITROSTAT) 0.4 MG SL tablet Place 1 tablet (0.4 mg total) under the tongue every 5 (five) minutes as needed for chest pain. 07/04/20   Abelino Derrick,  PA-C  pantoprazole (PROTONIX) 20 MG tablet Take 1 tablet (20 mg total) by mouth daily. 09/08/21   Loel Dubonnet, NP  Vitamin D, Ergocalciferol, (DRISDOL) 1.25 MG (50000 UNIT) CAPS capsule Take 1 capsule by mouth once a week. 04/11/21   [provider]    Family History Family History  Problem Relation Age of Onset   Diabetes Mother    Diabetes Father    Diabetes Sister     Social History Social History   Tobacco Use   Smoking status: Never   Smokeless tobacco: Never  Vaping Use   Vaping Use: Never used  Substance Use Topics   Alcohol use: Yes    Alcohol/week: 1.0 standard drink    Types: 1 Glasses of wine per week    Comment: occ wine   Drug use: Never     Allergies   Patient has no known allergies.   Review of Systems Review of Systems  As stated above in HPI Physical Exam Triage Vital Signs ED  Triage Vitals  Enc Vitals Group     BP 10/15/21 1340 114/76     Pulse Rate 10/15/21 1340 79     Resp 10/15/21 1340 19     Temp 10/15/21 1340 98.3 F (36.8 C)     Temp src --      SpO2 10/15/21 1340 95 %     Weight --      Height --      Head Circumference --      Peak Flow --      Pain Score 10/15/21 1338 5     Pain Loc --      Pain Edu? --      Excl. in [place]? --    No data found.  Updated Vital Signs BP 114/76   Pulse 79   Temp 98.3 F (36.8 C)   Resp 19   SpO2 95%   Physical Exam Vitals and nursing note reviewed.  Constitutional:      General: She is not in acute distress.    Appearance: Normal appearance. She is not ill-appearing, toxic-appearing or diaphoretic.  HENT:     Head: Normocephalic and atraumatic.     Right Ear: Tympanic membrane normal.     Left Ear: Tympanic membrane normal.     Nose: Congestion and rhinorrhea present.     Mouth/Throat:     Pharynx: No oropharyngeal exudate or posterior oropharyngeal erythema.  Eyes:     General: No scleral icterus.    Extraocular Movements: Extraocular movements intact.     Conjunctiva/sclera: Conjunctivae normal.     Pupils: Pupils are equal, round, and reactive to light.  Cardiovascular:     Rate and Rhythm: Normal rate and regular rhythm.     Heart sounds: Normal heart sounds.  Pulmonary:     Effort: Pulmonary effort is normal.     Breath sounds: Normal breath sounds.  Musculoskeletal:     Cervical back: Normal range of motion and neck supple.  Lymphadenopathy:     Cervical: No cervical adenopathy.  Skin:    General: Skin is warm.  Neurological:     Mental Status: She is alert and oriented to person, place, and time.     UC Treatments / Results  Labs (all labs ordered are listed, but only abnormal results are displayed) Labs Reviewed - No data to display  EKG   Radiology No results found.  Procedures Procedures (including critical care time)  Medications Ordered in UC  Medications - No data  to display  Initial Impression / Assessment and Plan / UC Course  I have reviewed the triage vital signs and the nursing notes.  Pertinent labs & imaging results that were available during my care of the patient were reviewed by me and considered in my medical decision making (see chart for details).     New. Likely viral in nature and likely influenza which we discussed. She elects tamiflu along with flonase and tessalon but would like CODVI-19 testing as well. Discussed red flag signs and symptoms along with common potential side effects and precautions. Rest and hydration encouraged. Follow up PRN.  Final Clinical Impressions(s) / UC Diagnoses   Final diagnoses:  None   Discharge Instructions   None    ED Prescriptions   None    PDMP not reviewed this encounter.   Hughie Closs, [place] 10/15/21 1428

## 2021-10-15 NOTE — ED Triage Notes (Signed)
Pt presents with complaints of cold chills and runny nose since Monday.

## 2021-10-16 ENCOUNTER — Other Ambulatory Visit (HOSPITAL_COMMUNITY): Payer: Self-pay

## 2021-10-16 ENCOUNTER — Telehealth (HOSPITAL_COMMUNITY): Payer: Self-pay | Admitting: Emergency Medicine

## 2021-10-16 LAB — SARS CORONAVIRUS 2 (TAT 6-24 HRS): SARS Coronavirus 2: POSITIVE — AB

## 2021-10-16 MED ORDER — MOLNUPIRAVIR EUA 200MG CAPSULE
4.0000 | ORAL_CAPSULE | Freq: Two times a day (BID) | ORAL | 0 refills | Status: AC
  Filled 2021-10-16: qty 40, 5d supply, fill #0

## 2022-02-05 DIAGNOSIS — G4730 Sleep apnea, unspecified: Secondary | ICD-10-CM | POA: Insufficient documentation

## 2022-02-05 NOTE — Progress Notes (Signed)
?  ?Cardiology Office Note ? ? ?Date:  02/06/2022  ? ?ID:  Idell Hissong, DOB [birth year], MRN [id number] ? ?PCP:  Fleet Contras, MD  ?Cardiologist:   Rollene Rotunda, MD ? ? ?Chief Complaint  ?Patient presents with  ? Coronary Artery Disease  ? Chest Pain  ? ? ?  ?History of Present Illness: ?Veronica Bowen is a 66 y.o. female who presents for follow up of NSTEMI with subsequent PCI of an LAD lesion.    At the last visit she had GERD and was started on Protonix.  Since I last saw her she continues to have symptoms that sound like a burning discomfort.  This was previously described as associated with gas.  She gets it when she just sitting in a chair.  She does not think is like her previous angina.  She has been found to have sleep apnea since I saw her but she has not been treated for that yet.  She says the chest discomfort is every night.  It is not with activity.  She is not having any new shortness of breath, PND or orthopnea.  She is not having any new palpitations, presyncope or syncope.  She has no weight gain or edema. ? ? ?Past Medical History:  ?Diagnosis Date  ? Arrhythmia   ? Chest pain 06/07/2020  ? Hyperlipidemia   ? Hypertension   ? NSTEMI (non-ST elevated myocardial infarction) (HCC)   ? Supraventricular tachycardia (HCC) 06/07/2020  ? Urinary tract infection 06/07/2020  ? ? ?Past Surgical History:  ?Procedure Laterality Date  ? CESAREAN SECTION  1989  ? CORONARY STENT INTERVENTION N/A 06/11/2020  ? Procedure: CORONARY STENT INTERVENTION;  Surgeon: Marykay Lex, MD;  Location: [facility] INVASIVE CV LAB;  Service: Cardiovascular;  Laterality: N/A;  ? FOOT SURGERY Right   ? hammer toes  both left and right  ? LEFT HEART CATH AND CORONARY ANGIOGRAPHY N/A 06/11/2020  ? Procedure: LEFT HEART CATH AND CORONARY ANGIOGRAPHY;  Surgeon: Marykay Lex, MD;  Location: [facility] INVASIVE CV LAB;  Service: Cardiovascular;  Laterality: N/A;  ? ? ? ?Current Outpatient Medications  ?Medication Sig Dispense Refill  ? ASPIRIN LOW DOSE 81  MG EC tablet Take 81 mg by mouth daily.    ? atorvastatin (LIPITOR) 40 MG tablet Take 1 tablet (40 mg total) by mouth daily. 90 tablet 3  ? benzonatate (TESSALON) 100 MG capsule Take 1 capsule (100 mg total) by mouth every 8 (eight) hours. 21 capsule 0  ? Blood Pressure Monitoring (BLOOD PRESSURE CUFF) MISC 1 Units by Does not apply route once for 1 dose. 1 each 0  ? famotidine (PEPCID) 40 MG tablet Take 1 tablet (40 mg total) by mouth daily. 30 tablet 11  ? fluticasone (FLONASE) 50 MCG/ACT nasal spray Place 2 sprays into both nostrils daily. 16 mL 0  ? ibuprofen (ADVIL) 800 MG tablet Take 1 tablet (800 mg total) by mouth every 8 (eight) hours as needed. 30 tablet 5  ? loratadine (CLARITIN) 10 MG tablet TAKE 1 TABLET(10 MG) BY MOUTH DAILY 30 tablet 11  ? losartan (COZAAR) 25 MG tablet Take 1 tablet (25 mg total) by mouth daily. 90 tablet 3  ? metoprolol succinate (TOPROL-XL) 50 MG 24 hr tablet Take 50 mg by mouth 2 (two) times daily.    ? albuterol (VENTOLIN HFA) 108 (90 Base) MCG/ACT inhaler as needed. Wheezing, sob (Patient not taking: Reported on 02/06/2022)    ? nitroGLYCERIN (NITROSTAT) 0.4 MG SL tablet Place 1  tablet (0.4 mg total) under the tongue every 5 (five) minutes as needed for chest pain. 25 tablet 12  ? oseltamivir (TAMIFLU) 75 MG capsule Take 1 capsule (75 mg total) by mouth every 12 (twelve) hours. (Patient not taking: Reported on 02/06/2022) 10 capsule 0  ? pantoprazole (PROTONIX) 20 MG tablet Take 1 tablet (20 mg total) by mouth daily. (Patient not taking: Reported on 02/06/2022) 30 tablet 5  ? Vitamin D, Ergocalciferol, (DRISDOL) 1.25 MG (50000 UNIT) CAPS capsule Take 1 capsule by mouth once a week. (Patient not taking: Reported on 02/06/2022)    ? ?No current facility-administered medications for this visit.  ? ? ?Allergies:   Patient has no known allergies.  ? ? ?ROS:  Please see the history of present illness.   Otherwise, review of systems are positive for none.   All other systems are reviewed and  negative.  ? ? ?PHYSICAL EXAM: ?VS:  BP (!) 170/94   Pulse 90   Ht 5\' 5"  (1.651 m)   Wt 208 lb 9.6 oz (94.6 kg)   SpO2 98%   BMI 34.71 kg/m?  , BMI Body mass index is 34.71 kg/m?. ?GENERAL:  Well appearing ?NECK:  No jugular venous distention, waveform within normal limits, carotid upstroke brisk and symmetric, no bruits, no thyromegaly ?LUNGS:  Clear to auscultation bilaterally ?CHEST:  Unremarkable ?HEART:  PMI not displaced or sustained,S1 and S2 within normal limits, no S3, no S4, no clicks, no rubs, no murmurs ?ABD:  Flat, positive bowel sounds normal in frequency in pitch, no bruits, no rebound, no guarding, no midline pulsatile mass, no hepatomegaly, no splenomegaly ?EXT:  2 plus pulses throughout, no edema, no cyanosis no clubbing ? ? ?EKG:  EKG is not ordered today. ? ? ? ?Recent Labs: ?No results found for requested labs within last 8760 hours.  ? ? ?Lipid Panel ?   ?Component Value Date/Time  ? CHOL 134 01/15/2021 0000  ? TRIG 143 01/15/2021 0000  ? HDL 45 (L) 01/15/2021 0000  ? CHOLHDL 3.0 01/15/2021 0000  ? VLDL 15 06/08/2020 0224  ? LDLCALC 66 01/15/2021 0000  ? ?  ? ?Wt Readings from Last 3 Encounters:  ?02/06/22 208 lb 9.6 oz (94.6 kg)  ?09/08/21 204 lb (92.5 kg)  ?05/08/21 201 lb 12.8 oz (91.5 kg)  ?  ?Diagnostic ?Dominance: Right ? ? ? ?Intervention ? ? ? ? ? ? ?Other studies Reviewed: ?Additional studies/ records that were reviewed today include:  Labs. ?Review of the above records demonstrates:  Please see elsewhere in the note.   ? ? ?ASSESSMENT AND PLAN: ? ?CAD:   The patient has no new sypmtoms.  No further cardiovascular testing is indicated.  We will continue with aggressive risk reduction and meds as listed.\ ? ?CHEST PAIN: Her discomfort has nonanginal etiologies.  Its not clear to me that she is taking the pantoprazole which was ordered and she is can go home and check.  Regardless I think she needs to see a gastroenterologist.  ? ?SVT:   She is not describing any recurrence of this.   ? ?HTN: Her blood pressure is elevated but this is unusual.  She is actually had her dose of Cozaar reduced and her blood pressures have been very well controlled.  She did not take her blood pressure this morning and she was rushing.  She is going to keep a blood pressure diary and we will try to send her a blood pressure cuff.  ? ?DYSLIPIDEMIA:  Her LDL was 66.  No change in therapy.  ? ?SLEEP APNEA:   She was found to have sleep apnea.    We are checking into her getting her CPAP. ? ?Current medicines are reviewed at length with the patient today.  The patient does not have concerns regarding medicines. ? ?The following changes have been made: None ? ?Labs/ tests ordered today include:  ? ?Orders Placed This Encounter  ?Procedures  ? Ambulatory referral to Gastroenterology  ? ? ? ?Disposition:   FU with me in after the above studies ? ? ?Signed, ?Rollene Rotunda, MD  ?02/06/2022 9:43 AM    ?[facility] ? ? ?

## 2022-02-06 ENCOUNTER — Encounter: Payer: Self-pay | Admitting: Cardiology

## 2022-02-06 ENCOUNTER — Other Ambulatory Visit: Payer: Self-pay

## 2022-02-06 ENCOUNTER — Ambulatory Visit (INDEPENDENT_AMBULATORY_CARE_PROVIDER_SITE_OTHER): Payer: 59 | Admitting: Cardiology

## 2022-02-06 VITALS — BP 170/94 | HR 90 | Ht 65.0 in | Wt 208.6 lb

## 2022-02-06 DIAGNOSIS — E785 Hyperlipidemia, unspecified: Secondary | ICD-10-CM | POA: Diagnosis not present

## 2022-02-06 DIAGNOSIS — G4730 Sleep apnea, unspecified: Secondary | ICD-10-CM

## 2022-02-06 DIAGNOSIS — I10 Essential (primary) hypertension: Secondary | ICD-10-CM | POA: Diagnosis not present

## 2022-02-06 DIAGNOSIS — R072 Precordial pain: Secondary | ICD-10-CM

## 2022-02-06 DIAGNOSIS — I471 Supraventricular tachycardia: Secondary | ICD-10-CM | POA: Diagnosis not present

## 2022-02-06 DIAGNOSIS — K2960 Other gastritis without bleeding: Secondary | ICD-10-CM

## 2022-02-06 MED ORDER — BLOOD PRESSURE CUFF MISC: 1.0000 [IU] | Freq: Once | 0 refills | Status: AC

## 2022-02-06 MED ORDER — NITROGLYCERIN 0.4 MG SL SUBL: 0.4000 mg | SUBLINGUAL_TABLET | SUBLINGUAL | 12 refills | Status: AC | PRN

## 2022-02-06 NOTE — Patient Instructions (Addendum)
Medication Instructions:  ?Your physician recommends that you continue on your current medications as directed. Please refer to the Current Medication list given to you today. ?*If you need a refill on your cardiac medications before your next appointment, please call your pharmacy* ? ? ?Lab Work: ?None ?If you have labs (blood work) drawn today and your tests are completely normal, you will receive your results only by: ?MyChart Message (if you have MyChart) OR ?A paper copy in the mail ?If you have any lab test that is abnormal or we need to change your treatment, we will call you to review the results. ? ? ?Testing/Procedures: ?None ? ? ?Follow-Up: ?At [facility], you and your health needs are our priority.  As part of our continuing mission to provide you with exceptional heart care, we have created designated Provider Care Teams.  These Care Teams include your primary Cardiologist (physician) and Advanced Practice Providers (APPs -  Physician Assistants and Nurse Practitioners) who all work together to provide you with the care you need, when you need it. ? ?We recommend signing up for the patient portal called "MyChart".  Sign up information is provided on this After Visit Summary.  MyChart is used to connect with patients for Virtual Visits (Telemedicine).  Patients are able to view lab/test results, encounter notes, upcoming appointments, etc.  Non-urgent messages can be sent to your provider as well.   ?To learn more about what you can do with MyChart, go to [email or web address].   ? ?Your next appointment:   ?6 month(s) ? ?The format for your next appointment:   ?In Person ? ?Provider:   ?Gillian Shields, NP ? ? ?Other Instructions ?  ?

## 2022-02-09 ENCOUNTER — Telehealth: Payer: Self-pay | Admitting: Licensed Clinical Social Worker

## 2022-02-09 NOTE — Telephone Encounter (Signed)
Received referral for BP cuff for pt, offered several options for assistance. Pt shared with RN that she would pick one up, our team remains available should pt be unable to access one.  ? ?Octavio Graves, MSW, LCSW ?Clinical Social Worker II ?[facility]  ?[phone]- work cell phone (preferred) ?[phone]- desk phone ? ? ? ?

## 2022-02-10 ENCOUNTER — Telehealth: Payer: Self-pay | Admitting: *Deleted

## 2022-02-10 NOTE — Telephone Encounter (Signed)
Received a call from [facility] with [facility] to inform me that she called the patient to set her up on her CPAP machine and was told by the patient that she has "messed up her insurance" and to call her back next week. Hopefully she will have it straighten out by then. ?

## 2022-02-23 ENCOUNTER — Other Ambulatory Visit: Payer: Self-pay | Admitting: Obstetrics

## 2022-02-23 DIAGNOSIS — R52 Pain, unspecified: Secondary | ICD-10-CM

## 2022-04-13 ENCOUNTER — Other Ambulatory Visit (HOSPITAL_BASED_OUTPATIENT_CLINIC_OR_DEPARTMENT_OTHER): Payer: Self-pay | Admitting: Family

## 2022-04-13 DIAGNOSIS — K219 Gastro-esophageal reflux disease without esophagitis: Secondary | ICD-10-CM

## 2022-04-13 NOTE — Telephone Encounter (Signed)
Rx(s) sent to pharmacy electronically.  

## 2022-05-05 ENCOUNTER — Other Ambulatory Visit: Payer: Self-pay | Admitting: Internal Medicine

## 2022-05-05 DIAGNOSIS — Z1231 Encounter for screening mammogram for malignant neoplasm of breast: Secondary | ICD-10-CM

## 2022-05-29 ENCOUNTER — Ambulatory Visit (INDEPENDENT_AMBULATORY_CARE_PROVIDER_SITE_OTHER): Payer: Medicare Other | Admitting: Gastroenterology

## 2022-05-29 ENCOUNTER — Encounter: Payer: Self-pay | Admitting: Gastroenterology

## 2022-05-29 VITALS — BP 164/92 | HR 75 | Ht 65.0 in | Wt 206.5 lb

## 2022-05-29 DIAGNOSIS — K5900 Constipation, unspecified: Secondary | ICD-10-CM | POA: Diagnosis not present

## 2022-05-29 DIAGNOSIS — K219 Gastro-esophageal reflux disease without esophagitis: Secondary | ICD-10-CM | POA: Diagnosis not present

## 2022-05-29 MED ORDER — OMEPRAZOLE 40 MG PO CPDR: 40.0000 mg | DELAYED_RELEASE_CAPSULE | Freq: Every day | ORAL | 3 refills | Status: DC

## 2022-07-13 ENCOUNTER — Ambulatory Visit
Admission: RE | Admit: 2022-07-13 | Discharge: 2022-07-13 | Disposition: A | Discharge status: Home / Self Care | Payer: Medicare Other | Source: Ambulatory Visit | Attending: Internal Medicine | Admitting: Internal Medicine

## 2022-07-13 DIAGNOSIS — Z1231 Encounter for screening mammogram for malignant neoplasm of breast: Secondary | ICD-10-CM

## 2022-09-04 ENCOUNTER — Other Ambulatory Visit: Payer: Self-pay | Admitting: Obstetrics

## 2022-09-04 DIAGNOSIS — J301 Allergic rhinitis due to pollen: Secondary | ICD-10-CM

## 2022-09-24 ENCOUNTER — Encounter (HOSPITAL_BASED_OUTPATIENT_CLINIC_OR_DEPARTMENT_OTHER): Payer: Self-pay

## 2022-09-24 ENCOUNTER — Other Ambulatory Visit: Payer: Self-pay | Admitting: Cardiology

## 2022-09-24 ENCOUNTER — Other Ambulatory Visit: Payer: Self-pay

## 2022-09-24 ENCOUNTER — Emergency Department (HOSPITAL_BASED_OUTPATIENT_CLINIC_OR_DEPARTMENT_OTHER): Payer: Medicare HMO | Admitting: Radiology

## 2022-09-24 ENCOUNTER — Emergency Department (HOSPITAL_BASED_OUTPATIENT_CLINIC_OR_DEPARTMENT_OTHER)
Admission: EM | Admit: 2022-09-24 | Discharge: 2022-09-24 | Disposition: A | Discharge status: Home / Self Care | Payer: Medicare HMO | Attending: Emergency Medicine | Admitting: Emergency Medicine

## 2022-09-24 ENCOUNTER — Emergency Department (INDEPENDENT_AMBULATORY_CARE_PROVIDER_SITE_OTHER): Payer: Medicare HMO

## 2022-09-24 DIAGNOSIS — R911 Solitary pulmonary nodule: Secondary | ICD-10-CM | POA: Insufficient documentation

## 2022-09-24 DIAGNOSIS — Z79899 Other long term (current) drug therapy: Secondary | ICD-10-CM | POA: Insufficient documentation

## 2022-09-24 DIAGNOSIS — R002 Palpitations: Secondary | ICD-10-CM | POA: Insufficient documentation

## 2022-09-24 DIAGNOSIS — I10 Essential (primary) hypertension: Secondary | ICD-10-CM | POA: Insufficient documentation

## 2022-09-24 DIAGNOSIS — Z20822 Contact with and (suspected) exposure to covid-19: Secondary | ICD-10-CM | POA: Diagnosis not present

## 2022-09-24 DIAGNOSIS — I2510 Atherosclerotic heart disease of native coronary artery without angina pectoris: Secondary | ICD-10-CM | POA: Diagnosis not present

## 2022-09-24 LAB — CBC
HCT: 35.7 % — ABNORMAL LOW (ref 36.0–46.0)
Hemoglobin: 11.4 g/dL — ABNORMAL LOW (ref 12.0–15.0)
MCH: 27.1 pg (ref 26.0–34.0)
MCHC: 31.9 g/dL (ref 30.0–36.0)
MCV: 84.8 fL (ref 80.0–100.0)
Platelets: 254 10*3/uL (ref 150–400)
RBC: 4.21 MIL/uL (ref 3.87–5.11)
RDW: 14.2 % (ref 11.5–15.5)
WBC: 7.8 10*3/uL (ref 4.0–10.5)
nRBC: 0 % (ref 0.0–0.2)

## 2022-09-24 LAB — BASIC METABOLIC PANEL
Anion gap: 7 (ref 5–15)
BUN: 16 mg/dL (ref 8–23)
CO2: 26 mmol/L (ref 22–32)
Calcium: 9.2 mg/dL (ref 8.9–10.3)
Chloride: 105 mmol/L (ref 98–111)
Creatinine, Ser: 1.15 mg/dL — ABNORMAL HIGH (ref 0.44–1.00)
GFR, Estimated: 53 mL/min — ABNORMAL LOW (ref >60)
Glucose, Bld: 94 mg/dL (ref 70–99)
Potassium: 4 mmol/L (ref 3.5–5.1)
Sodium: 138 mmol/L (ref 135–145)

## 2022-09-24 LAB — TSH: TSH: 2.58 u[IU]/mL (ref 0.350–4.500)

## 2022-09-24 LAB — TROPONIN I (HIGH SENSITIVITY): Troponin I (High Sensitivity): 16 ng/L (ref <18)

## 2022-09-24 LAB — MAGNESIUM: Magnesium: 1.9 mg/dL (ref 1.7–2.4)

## 2022-09-24 LAB — SARS CORONAVIRUS 2 BY RT PCR: SARS Coronavirus 2 by RT PCR: NEGATIVE

## 2022-09-24 NOTE — Progress Notes (Signed)
    Cardiac monitor ordered for palpitations. Dr. Percival Spanish PCP cardiologist to read. Will arrange follow up appt

## 2022-09-24 NOTE — Discharge Instructions (Addendum)
Today you were seen in the emergency department for your palpitations.    In the emergency department you had EKG and lab work that were reassuring.    At home, please refrain from drinking excessive amounts of caffeine.    Cardiology will contact you regarding a Holter monitor which will be sent to your house with instructions.  If you do not hear from them in 72 hours please contact them using the phone number on this packet.  Follow-up with your primary doctor in 2-3 days regarding your visit.    Return immediately to the emergency department if you experience any of the following: Fainting, chest pain, difficulty breathing, or any other concerning symptoms.    Thank you for visiting our Emergency Department. It was a pleasure taking care of you today.

## 2022-09-24 NOTE — Progress Notes (Unsigned)
Enrolled for [employer] to mail a ZIO XT long term holter monitor to the patients address on file.  

## 2022-09-24 NOTE — ED Triage Notes (Signed)
Pt reports she has been having episodes where her heart has felt like it was racing since Monday. Denies cp, but reports sob with each episode. Pt denies any other associated symptoms. Hx of nstemi and SVT. Pt states she also could just be having anxiety attacks. Pt AxOx4, NAD.

## 2022-09-24 NOTE — ED Notes (Signed)
Patient transported to X-ray 

## 2022-09-24 NOTE — ED Provider Notes (Signed)
[facility] DEPT Provider Note   CSN: [id number] Arrival date & time: 09/24/22  1337     History  Chief Complaint  Patient presents with   Palpitations   Shortness of Breath    Veronica Bowen is a 66 y.o. female.  66 year old female with a history of CAD status post PCI, SVT, and possible thyroid disease who presents emergency department with palpitations.  Patient states that on Monday and Tuesday she had off-and-on palpitations all day lasting several minutes at a time.  Says she would have shortness of breath associated with episodes but no chest pain.  No calf pain or calf swelling.  No history of DVT, PE, cancer, or hormone use.  No recent surgeries.  Says that she was previously on metoprolol but is unsure if she still taking this medication now.  Says that she does drink coffee all day and alcohol but only socially without any heavy drinking recently.  Denies any other illicit substance use.  Denies any cough but did have some congestion on Monday.  Denies any fevers, chills, nausea, vomiting or diarrhea.  Says that she had abnormal thyroid test in the past but was not started on any medications for that.    Past Medical History:  Diagnosis Date   Arrhythmia    Chest pain 06/07/2020   Hyperlipidemia    Hypertension    NSTEMI (non-ST elevated myocardial infarction) ([facility])    Supraventricular tachycardia 06/07/2020   Urinary tract infection 06/07/2020      Home Medications Prior to Admission medications   Medication Sig Start Date End Date Taking? Authorizing Provider  albuterol (VENTOLIN HFA) 108 (90 Base) MCG/ACT inhaler as needed. Wheezing, sob 04/28/21   [provider]  ASPIRIN LOW DOSE 81 MG EC tablet Take 81 mg by mouth daily. 08/19/21   [provider]  atorvastatin (LIPITOR) 40 MG tablet Take 1 tablet (40 mg total) by mouth daily. 03/04/21   Minus Breeding, MD  famotidine (PEPCID) 40 MG tablet Take 1 tablet (40 mg total) by mouth  daily. 05/08/21   Shelly Bombard, MD  fluticasone (FLONASE) 50 MCG/ACT nasal spray Place 2 sprays into both nostrils daily. 10/15/21   Hughie Closs, PA-C  ibuprofen (ADVIL) 800 MG tablet TAKE 1 TABLET(800 MG) BY MOUTH EVERY 8 HOURS AS NEEDED 02/23/22   Shelly Bombard, MD  loratadine (CLARITIN) 10 MG tablet TAKE 1 TABLET(10 MG) BY MOUTH DAILY 08/20/21   Shelly Bombard, MD  losartan (COZAAR) 25 MG tablet Take 1 tablet (25 mg total) by mouth daily. 03/04/21 02/06/22  Minus Breeding, MD  metoprolol succinate (TOPROL-XL) 50 MG 24 hr tablet Take 50 mg by mouth 2 (two) times daily. 06/18/21   [provider]  nitroGLYCERIN (NITROSTAT) 0.4 MG SL tablet Place 1 tablet (0.4 mg total) under the tongue every 5 (five) minutes as needed for chest pain. 02/06/22   Minus Breeding, MD  omeprazole (PRILOSEC) 40 MG capsule Take 1 capsule (40 mg total) by mouth daily. 05/29/22   Daryel November, MD  Vitamin D, Ergocalciferol, (DRISDOL) 1.25 MG (50000 UNIT) CAPS capsule Take 1 capsule by mouth once a week. Patient not taking: Reported on 05/29/2022 04/11/21   [provider]      Allergies    Shellfish allergy    Review of Systems   Review of Systems  Physical Exam Updated Vital Signs BP (!) 112/54   Pulse 65   Temp 98.2 F (36.8 C)   Resp (!)  23   Ht 5\' 5"  (1.651 m)   Wt 94.8 kg   SpO2 100%   BMI 34.78 kg/m  Physical Exam Vitals and nursing note reviewed.  Constitutional:      General: She is not in acute distress.    Appearance: She is well-developed.  HENT:     Head: Normocephalic and atraumatic.     Right Ear: External ear normal.     Left Ear: External ear normal.     Nose: Nose normal.  Eyes:     Extraocular Movements: Extraocular movements intact.     Conjunctiva/sclera: Conjunctivae normal.     Pupils: Pupils are equal, round, and reactive to light.  Cardiovascular:     Rate and Rhythm: Normal rate and regular rhythm.     Heart sounds: No murmur  heard. Pulmonary:     Effort: Pulmonary effort is normal. No respiratory distress.     Breath sounds: Normal breath sounds.  Abdominal:     General: Abdomen is flat. There is no distension.  Musculoskeletal:        General: No swelling.     Cervical back: Normal range of motion and neck supple.     Right lower leg: Edema (trace) present.     Left lower leg: Edema (trace) present.  Skin:    General: Skin is warm and dry.     Capillary Refill: Capillary refill takes less than 2 seconds.  Neurological:     Mental Status: She is alert and oriented to person, place, and time. Mental status is at baseline.  Psychiatric:        Mood and Affect: Mood normal.     ED Results / Procedures / Treatments   Labs (all labs ordered are listed, but only abnormal results are displayed) Labs Reviewed  BASIC METABOLIC PANEL - Abnormal; Notable for the following components:      Result Value   Creatinine, Ser 1.15 (*)    GFR, Estimated 53 (*)    All other components within normal limits  CBC - Abnormal; Notable for the following components:   Hemoglobin 11.4 (*)    HCT 35.7 (*)    All other components within normal limits  SARS CORONAVIRUS 2 BY RT PCR  MAGNESIUM  TSH  TROPONIN I (HIGH SENSITIVITY)    EKG EKG Interpretation  Date/Time:  Thursday September 24 2022 17:47:17 EDT Ventricular Rate:  65 PR Interval:  151 QRS Duration: 83 QT Interval:  441 QTC Calculation: 459 R Axis:   16 Text Interpretation: Sinus rhythm Low voltage, precordial leads Borderline T abnormalities, inferior leads When compared to 06/12/20 TWI in inferior and lateral leads have improved Confirmed by 08/13/20 [id number]) on 09/24/2022 6:00:24 PM  Radiology DG Chest 2 View  Result Date: 09/24/2022 CLINICAL DATA:  Short of breath with heart palpitations EXAM: CHEST - 2 VIEW COMPARISON:  Chest 06/07/2020 FINDINGS: Heart size and vascularity normal.  Negative for heart failure Negative for infiltrate or effusion  Question soft tissue nodule overlying the left lung base. This density is overlying overlapping ribs, and could be due to bony density versus lung nodule. IMPRESSION: Possible 1 cm lung nodule left lung base. Alternately, this could be due to overlapping ribs. Recommend short-term chest x-ray follow-up versus chest CT for further clarification. Electronically Signed   By: 08/08/2020 M.D.   On: 09/24/2022 14:05    Procedures Procedures   Medications Ordered in ED Medications - No data to display  ED Course/ Medical Decision  Making/ A&P Clinical Course as of 09/25/22 2037  Thu Sep 24, 2022  1510 Spoke with Dr Royann Shivers from cardiology about the patient. Will mail Holter monitor to the patient and ensure that she gets follow-up with them in clinic. [RP]    Clinical Course User Index [RP] Rondel Baton, MD                           Medical Decision Making Amount and/or Complexity of Data Reviewed Labs: ordered. Radiology: ordered.   Veronica Bowen is a 66 y.o. female with comorbidities that complicate the patient evaluation including CAD status post PCI, SVT, and possible thyroid disease who presents emergency department with palpitations.  This patient presents to the ED for concern of complaints listed in HPI, this involves an extensive number of treatment options, and is a complaint that carries with it a high risk of complications and morbidity.   Initial Ddx:  Paroxysmal arrhythmia, SVT, atrial fibrillation, MI, hypothyroidism  MDM:  Feel the patient is likely having paroxysmal SVT given her history and excessive caffeine use.  Does not have any chest pain, vomiting, or diaphoresis suggest an MI.  Does have mild shortness of breath that appears to resolve after the episodes of palpitations so low concern for additional pulmonary pathology.  Plan:  Labs TSH Troponin EKG Chest x-ray Cardiology consult  ED Summary/Re-evaluation:  Patient underwent the above work-up which  did not show any electrolyte abnormalities or thyroid abnormalities.  Chest x-ray and EKG did not show any concerning findings.  Did discuss with cardiology who agrees that she should have a Holter monitor which they will set up for her along with follow-up as an outpatient.  Also informed the patient that she should reduce her caffeine intake follow-up with her primary doctor.  Dispo: DC Home. Return precautions discussed including, but not limited to, those listed in the AVS. Allowed pt time to ask questions which were answered fully prior to dc.  Records reviewed Care Everywhere The following labs were independently interpreted: Chemistry and CBC and show chronic anemia and CKD I independently reviewed the following imaging with scope of interpretation limited to determining acute life threatening conditions related to emergency care: Chest x-ray, which revealed no acute abnormality  I personally reviewed and interpreted cardiac monitoring: normal sinus rhythm  I personally reviewed and interpreted the pt's EKG: see above for interpretation  Consults: Cardiology  Final Clinical Impression(s) / ED Diagnoses Final diagnoses:  Palpitations  Lung nodule seen on imaging study    Rx / DC Orders ED Discharge Orders     None         Rondel Baton, MD 09/25/22 2037

## 2022-10-11 DIAGNOSIS — R002 Palpitations: Secondary | ICD-10-CM | POA: Diagnosis not present

## 2022-10-16 ENCOUNTER — Ambulatory Visit: Payer: Medicare HMO | Attending: Nurse Practitioner | Admitting: Nurse Practitioner

## 2022-10-20 ENCOUNTER — Other Ambulatory Visit: Payer: Self-pay | Admitting: Family

## 2022-10-20 DIAGNOSIS — R911 Solitary pulmonary nodule: Secondary | ICD-10-CM

## 2022-10-22 ENCOUNTER — Encounter: Payer: Self-pay | Admitting: Nurse Practitioner

## 2022-11-02 ENCOUNTER — Encounter: Payer: Self-pay | Admitting: Family

## 2022-11-04 ENCOUNTER — Other Ambulatory Visit: Payer: Medicare HMO

## 2022-11-16 ENCOUNTER — Other Ambulatory Visit: Payer: Self-pay | Admitting: Obstetrics

## 2022-11-16 ENCOUNTER — Encounter: Payer: Self-pay | Admitting: *Deleted

## 2022-11-16 DIAGNOSIS — J301 Allergic rhinitis due to pollen: Secondary | ICD-10-CM

## 2023-03-09 ENCOUNTER — Other Ambulatory Visit: Payer: Self-pay | Admitting: Obstetrics

## 2023-03-09 DIAGNOSIS — R52 Pain, unspecified: Secondary | ICD-10-CM

## 2023-03-16 IMAGING — MG MM DIGITAL SCREENING BILAT W/ TOMO AND CAD
8 series · 8 of 24 positions shown · non-contrast
Comparison: Previous exam(s).

CLINICAL DATA: Screening.

EXAM:
DIGITAL SCREENING BILATERAL MAMMOGRAM WITH TOMOSYNTHESIS AND CAD
TECHNIQUE: Bilateral screening digital craniocaudal and mediolateral oblique
mammograms were obtained. Bilateral screening digital breast
tomosynthesis was performed. The images were evaluated with
computer-aided detection.

[L MLO synth-2D]
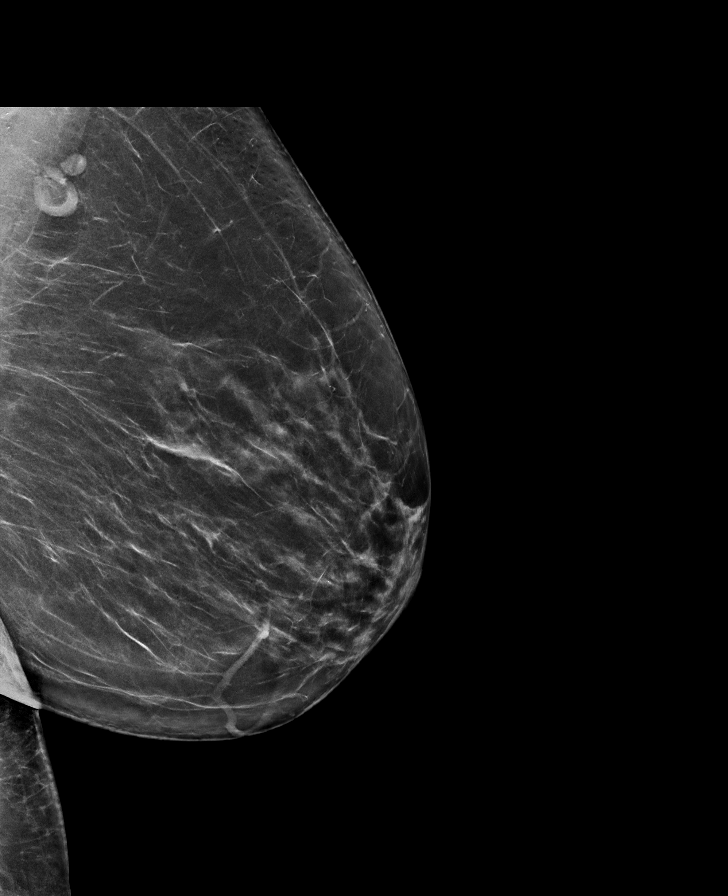

[R MLO synth-2D]
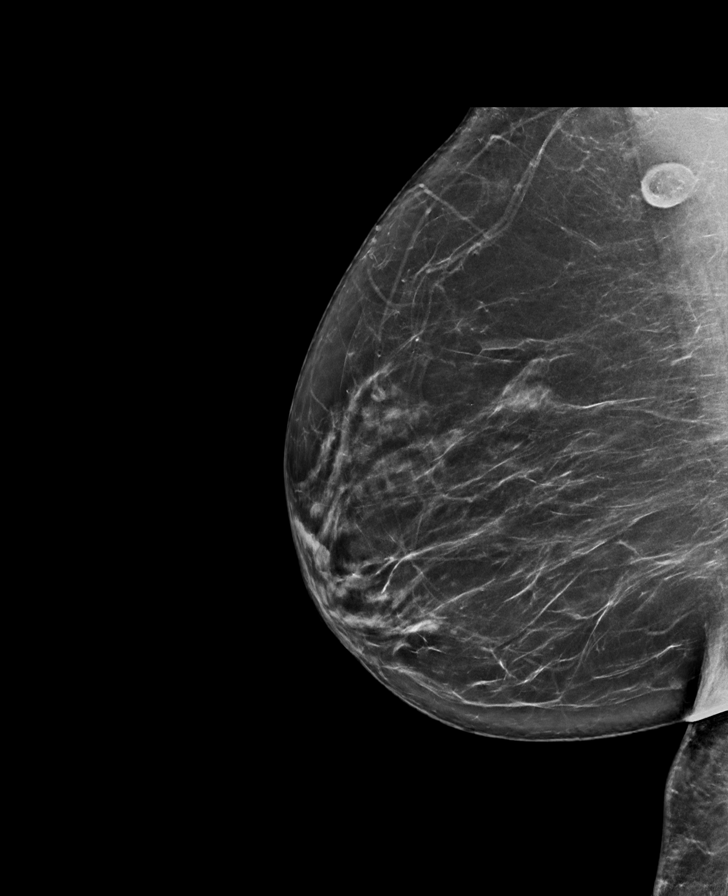

[L CC synth-2D]
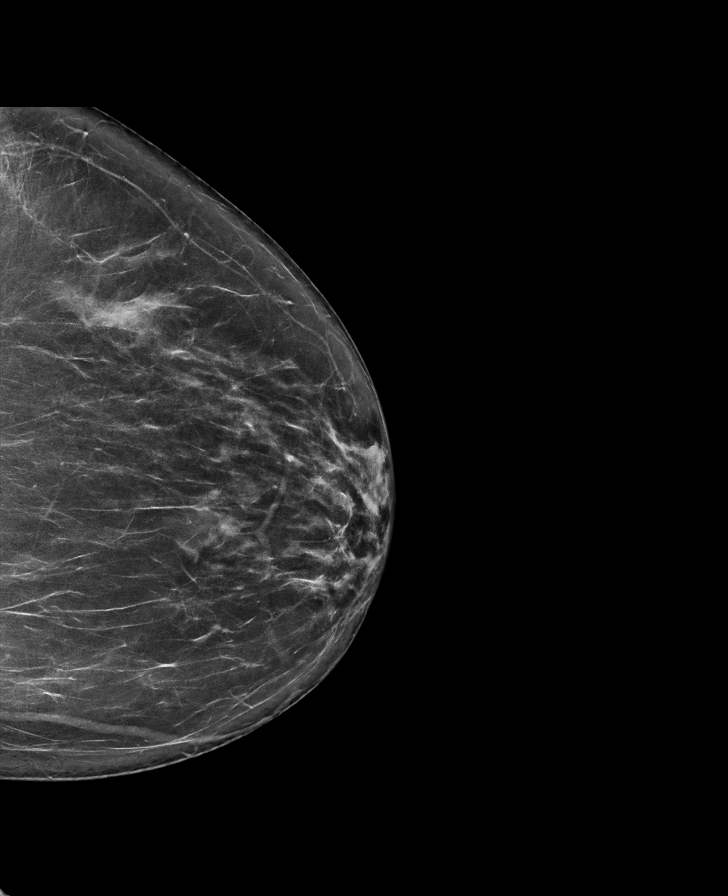

[R CC synth-2D]
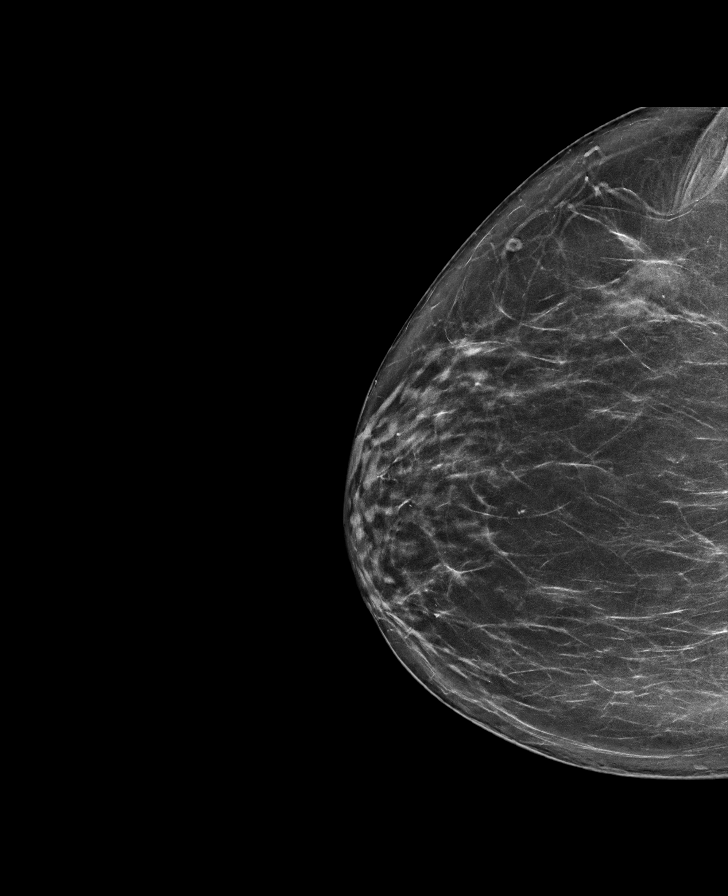

[R MLO tomo · tomo slice 41/81.0]
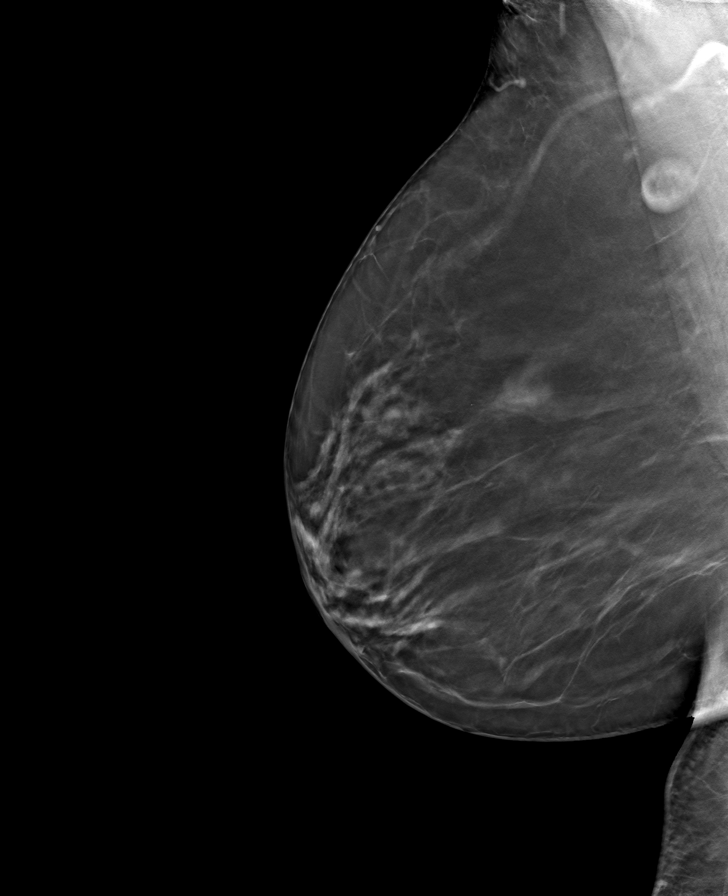

[R CC tomo · tomo slice 38/75.0]
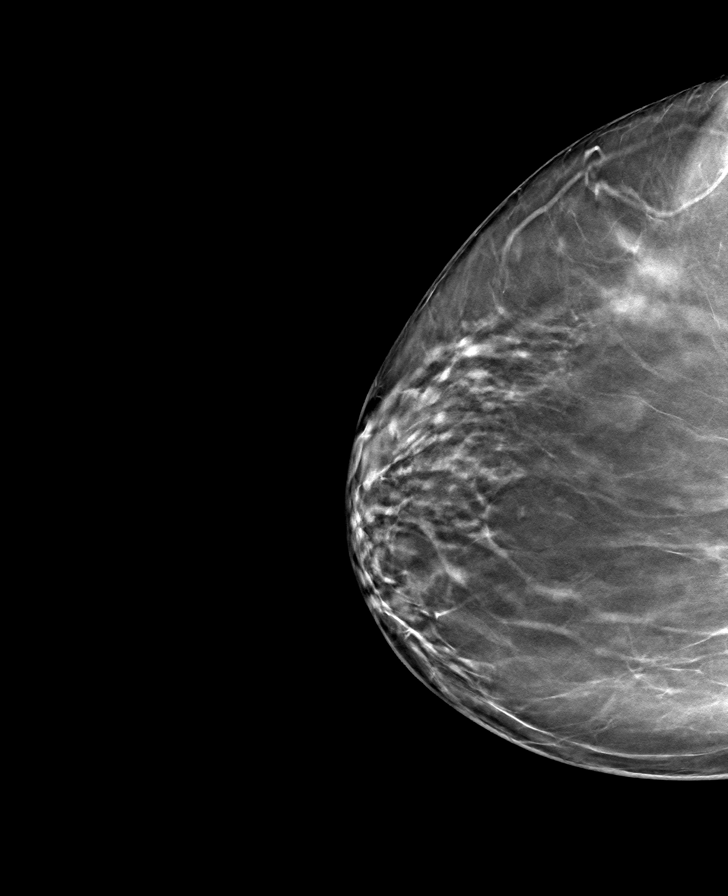

[L CC tomo · tomo slice 37/73.0]
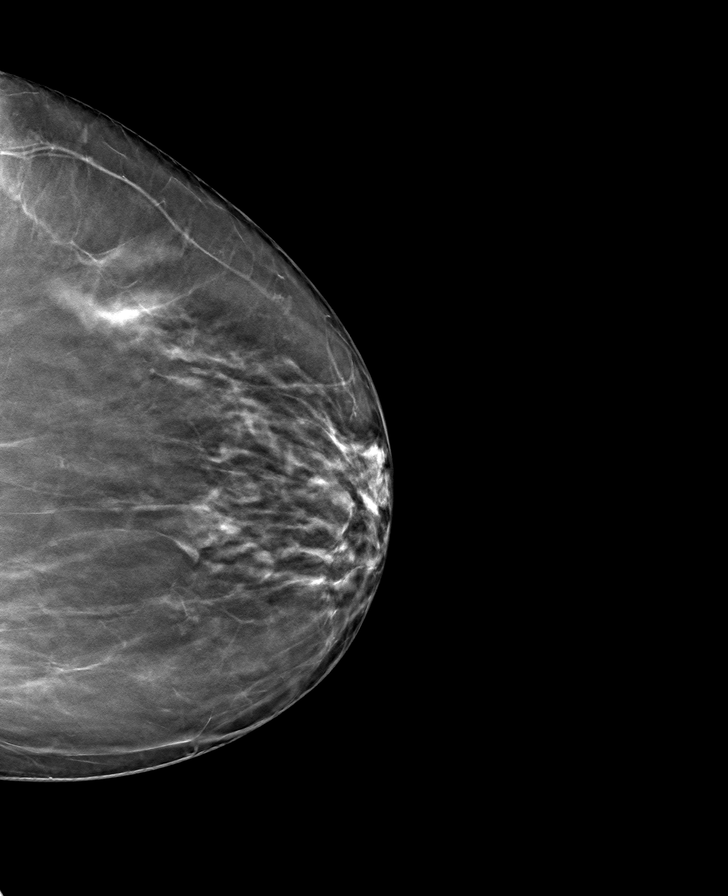

[L MLO tomo · tomo slice 39/78.0]
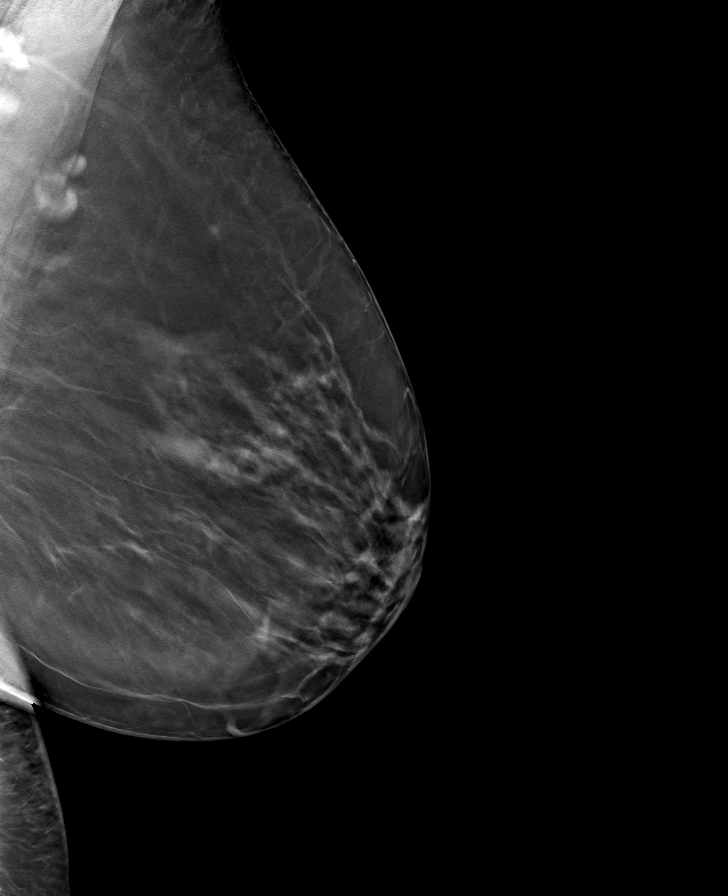

[8 of 24 positions shown; findings below may reference images not displayed]

ACR Breast Density Category b: There are scattered areas of
fibroglandular density.
FINDINGS: There are no findings suspicious for malignancy.
IMPRESSION: No mammographic evidence of malignancy. A result letter of this
screening mammogram will be mailed directly to the patient.

RECOMMENDATION:
Screening mammogram in one year. (Code:51-O-LD2)

BI-RADS CATEGORY  1: Negative.

## 2023-03-17 ENCOUNTER — Other Ambulatory Visit: Payer: Self-pay | Admitting: Internal Medicine

## 2023-03-18 LAB — COMPLETE METABOLIC PANEL WITHOUT GFR
AG Ratio: 1.2 (calc) (ref 1.0–2.5)
ALT: 10 U/L (ref 6–29)
AST: 15 U/L (ref 10–35)
Albumin: 4 g/dL (ref 3.6–5.1)
Alkaline phosphatase (APISO): 83 U/L (ref 37–153)
BUN: 13 mg/dL (ref 7–25)
CO2: 27 mmol/L (ref 20–32)
Calcium: 9.6 mg/dL (ref 8.6–10.4)
Chloride: 105 mmol/L (ref 98–110)
Creat: 0.83 mg/dL (ref 0.50–1.05)
Globulin: 3.3 g/dL (ref 1.9–3.7)
Glucose, Bld: 82 mg/dL (ref 65–99)
Potassium: 4.3 mmol/L (ref 3.5–5.3)
Sodium: 141 mmol/L (ref 135–146)
Total Bilirubin: 0.3 mg/dL (ref 0.2–1.2)
Total Protein: 7.3 g/dL (ref 6.1–8.1)
eGFR: 78 mL/min/1.73m2

## 2023-03-18 LAB — CBC
HCT: 37.6 % (ref 35.0–45.0)
Hemoglobin: 12 g/dL (ref 11.7–15.5)
MCH: 26 pg — ABNORMAL LOW (ref 27.0–33.0)
MCHC: 31.9 g/dL — ABNORMAL LOW (ref 32.0–36.0)
MCV: 81.4 fL (ref 80.0–100.0)
MPV: 11.5 fL (ref 7.5–12.5)
Platelets: 195 10*3/uL (ref 140–400)
RBC: 4.62 10*6/uL (ref 3.80–5.10)
RDW: 13.5 % (ref 11.0–15.0)
WBC: 3.9 10*3/uL (ref 3.8–10.8)

## 2023-03-18 LAB — LIPID PANEL
Cholesterol: 134 mg/dL (ref <200)
HDL: 55 mg/dL (ref >=50)
LDL Cholesterol (Calc): 66 mg/dL (calc)
Non-HDL Cholesterol (Calc): 79 mg/dL (calc) (ref <130)
Total CHOL/HDL Ratio: 2.4 (calc) (ref <5.0)
Triglycerides: 57 mg/dL (ref <150)

## 2023-03-18 LAB — TSH: TSH: 3.04 m[IU]/L (ref 0.40–4.50)

## 2023-03-18 LAB — VITAMIN D 25 HYDROXY (VIT D DEFICIENCY, FRACTURES): Vit D, 25-Hydroxy: 26 ng/mL — ABNORMAL LOW (ref 30–100)

## 2023-04-13 ENCOUNTER — Other Ambulatory Visit: Payer: Self-pay | Admitting: Internal Medicine

## 2023-04-13 DIAGNOSIS — Z1231 Encounter for screening mammogram for malignant neoplasm of breast: Secondary | ICD-10-CM

## 2023-07-16 ENCOUNTER — Ambulatory Visit
Admission: RE | Admit: 2023-07-16 | Discharge: 2023-07-16 | Disposition: A | Discharge status: Home / Self Care | Payer: Medicare Other | Source: Ambulatory Visit | Attending: Internal Medicine | Admitting: Internal Medicine

## 2023-07-16 DIAGNOSIS — Z1231 Encounter for screening mammogram for malignant neoplasm of breast: Secondary | ICD-10-CM

## 2023-08-25 ENCOUNTER — Other Ambulatory Visit: Payer: Self-pay

## 2023-08-25 ENCOUNTER — Encounter (HOSPITAL_COMMUNITY): Payer: Self-pay | Admitting: *Deleted

## 2023-08-25 ENCOUNTER — Ambulatory Visit (HOSPITAL_COMMUNITY)
Admission: EM | Admit: 2023-08-25 | Discharge: 2023-08-25 | Disposition: A | Discharge status: Home / Self Care | Payer: Medicare Other | Attending: Family Medicine | Admitting: Family Medicine

## 2023-08-25 DIAGNOSIS — Z20822 Contact with and (suspected) exposure to covid-19: Secondary | ICD-10-CM | POA: Insufficient documentation

## 2023-08-25 DIAGNOSIS — R42 Dizziness and giddiness: Secondary | ICD-10-CM | POA: Insufficient documentation

## 2023-08-25 DIAGNOSIS — R6883 Chills (without fever): Secondary | ICD-10-CM | POA: Diagnosis present

## 2023-08-25 LAB — CBC WITH DIFFERENTIAL/PLATELET
Abs Immature Granulocytes: 0.02 10*3/uL (ref 0.00–0.07)
Basophils Absolute: 0 10*3/uL (ref 0.0–0.1)
Basophils Relative: 1 %
Eosinophils Absolute: 0 10*3/uL (ref 0.0–0.5)
Eosinophils Relative: 1 %
HCT: 37.6 % (ref 36.0–46.0)
Hemoglobin: 12.2 g/dL (ref 12.0–15.0)
Immature Granulocytes: 0 %
Lymphocytes Relative: 29 %
Lymphs Abs: 1.3 10*3/uL (ref 0.7–4.0)
MCH: 26.7 pg (ref 26.0–34.0)
MCHC: 32.4 g/dL (ref 30.0–36.0)
MCV: 82.3 fL (ref 80.0–100.0)
Monocytes Absolute: 0.5 10*3/uL (ref 0.1–1.0)
Monocytes Relative: 12 %
Neutro Abs: 2.5 10*3/uL (ref 1.7–7.7)
Neutrophils Relative %: 57 %
Platelets: 215 10*3/uL (ref 150–400)
RBC: 4.57 MIL/uL (ref 3.87–5.11)
RDW: 13.5 % (ref 11.5–15.5)
WBC: 4.5 10*3/uL (ref 4.0–10.5)
nRBC: 0 % (ref 0.0–0.2)

## 2023-08-25 LAB — POCT URINALYSIS DIP (MANUAL ENTRY)
Bilirubin, UA: NEGATIVE
Glucose, UA: NEGATIVE mg/dL
Ketones, POC UA: NEGATIVE mg/dL
Leukocytes, UA: NEGATIVE
Nitrite, UA: NEGATIVE
Protein Ur, POC: >=300 mg/dL — AB
Spec Grav, UA: >=1.03 — AB (ref 1.010–1.025)
Urobilinogen, UA: 0.2 U/dL
pH, UA: 6 (ref 5.0–8.0)

## 2023-08-25 NOTE — Discharge Instructions (Addendum)
I am checking for COVID and also checking a blood count to ensure that your hemoglobin is normal.  You will be notified via phone if any of your results require any further treatment.  Will take up to 24 hours for your COVID test result however the blood work should return later this evening.

## 2023-08-25 NOTE — ED Triage Notes (Signed)
Pt has Ha,chills and dizziness for over a week.

## 2023-08-25 NOTE — ED Provider Notes (Signed)
[facility]    CSN: [id number] Arrival date & time: 08/25/23  0940      History   Chief Complaint Chief Complaint  Patient presents with   Chills   Dizziness   Migraine    HPI Veronica Bowen is a 67 y.o. female.  Patient with a history of MI, SVT, and urinary tract infections presents today with a 1 week history of chills, dizziness, and mild  headaches.  Patient reports feeling fine during the day however during the night she reports being increasingly cold and shaking.  She reports she has been treating symptoms with Tylenol and ibuprofen although denies fever or bodyaches.  On chart review patient has no history of recurrent headaches.  Denies any other associated symptoms such as generalized bodyaches, chest pain, cough or congestion.  No known sick contacts.  She denies any sick contacts.  Reports approximately 1 month ago seeing her primary care doctor who stopped one of her blood pressure medications.  She reports having blood work done at that time and was told that her lead level was below but denies at that time having any dizziness, weakness, or changes to blood pressure that she is aware of.  Patient reports she does not routinely check her blood pressure at home therefore is uncertain if BP has been running soft.   Past Medical History:  Diagnosis Date   Arrhythmia    Chest pain 06/07/2020   Hyperlipidemia    Hypertension    NSTEMI (non-ST elevated myocardial infarction) ([facility])    Supraventricular tachycardia 06/07/2020   Urinary tract infection 06/07/2020    Patient Active Problem List   Diagnosis Date Noted   Sleep apnea 02/05/2022   Snoring 03/03/2021   CAD S/P percutaneous coronary angioplasty 07/04/2020   Hypertension 06/07/2020   Hyperlipidemia 06/07/2020   Urinary tract infection 06/07/2020   Chest pain 06/07/2020   Supraventricular tachycardia 06/07/2020   NSTEMI (non-ST elevated myocardial infarction) [facility])     Past Surgical History:  Procedure  Laterality Date   CESAREAN SECTION  1989   CORONARY STENT INTERVENTION N/A 06/11/2020   Procedure: CORONARY STENT INTERVENTION;  Surgeon: Marykay Lex, MD;  Location: [facility] INVASIVE CV LAB;  Service: Cardiovascular;  Laterality: N/A;   FOOT SURGERY Right    hammer toes  both left and right   LEFT HEART CATH AND CORONARY ANGIOGRAPHY N/A 06/11/2020   Procedure: LEFT HEART CATH AND CORONARY ANGIOGRAPHY;  Surgeon: Marykay Lex, MD;  Location: [facility] INVASIVE CV LAB;  Service: Cardiovascular;  Laterality: N/A;    OB History     Gravida  3   Para      Term      Preterm      AB  1   Living  2      SAB      IAB  1   Ectopic      Multiple      Live Births  2            Home Medications    Prior to Admission medications   Medication Sig Start Date End Date Taking? Authorizing Provider  albuterol (VENTOLIN HFA) 108 (90 Base) MCG/ACT inhaler as needed. Wheezing, sob 04/28/21  Yes [provider]  ASPIRIN LOW DOSE 81 MG EC tablet Take 81 mg by mouth daily. 08/19/21  Yes [provider]  atorvastatin (LIPITOR) 40 MG tablet Take 1 tablet (40 mg total) by mouth daily. 03/04/21  Yes Rollene Rotunda, MD  famotidine (PEPCID) 40 MG tablet Take 1 tablet (40 mg total) by mouth daily. 05/08/21  Yes Brock Bad, MD  fluticasone (FLONASE) 50 MCG/ACT nasal spray Place 2 sprays into both nostrils daily. 10/15/21  Yes Covington, Maralyn Sago M, PA-C  ibuprofen (ADVIL) 800 MG tablet TAKE 1 TABLET(800 MG) BY MOUTH EVERY 8 HOURS AS NEEDED 03/09/23  Yes Brock Bad, MD  loratadine (CLARITIN) 10 MG tablet TAKE 1 TABLET(10 MG) BY MOUTH DAILY 11/17/22  Yes Brock Bad, MD  metoprolol succinate (TOPROL-XL) 50 MG 24 hr tablet Take 50 mg by mouth 2 (two) times daily. 06/18/21  Yes [provider]  nitroGLYCERIN (NITROSTAT) 0.4 MG SL tablet Place 1 tablet (0.4 mg total) under the tongue every 5 (five) minutes as needed for chest pain. 02/06/22  Yes Rollene Rotunda, MD   omeprazole (PRILOSEC) 40 MG capsule Take 1 capsule (40 mg total) by mouth daily. 05/29/22  Yes Jenel Lucks, MD  Vitamin D, Ergocalciferol, (DRISDOL) 1.25 MG (50000 UNIT) CAPS capsule Take 1 capsule by mouth once a week. 04/11/21  Yes [provider]  losartan (COZAAR) 25 MG tablet Take 1 tablet (25 mg total) by mouth daily. 03/04/21 02/06/22  Rollene Rotunda, MD    Family History Family History  Problem Relation Age of Onset   Diabetes Mother    Diabetes Father    Diabetes Sister    Colon cancer Neg Hx    Stomach cancer Neg Hx    Rectal cancer Neg Hx    Esophageal cancer Neg Hx    Liver cancer Neg Hx    Pancreatic cancer Neg Hx     Social History Social History   Tobacco Use   Smoking status: Never   Smokeless tobacco: Never  Vaping Use   Vaping status: Never Used  Substance Use Topics   Alcohol use: Yes    Alcohol/week: 1.0 standard drink of alcohol    Types: 1 Glasses of wine per week    Comment: occ wine   Drug use: Never     Allergies   Shellfish allergy   Review of Systems Review of Systems  Neurological:  Positive for dizziness.     Physical Exam Triage Vital Signs ED Triage Vitals  Encounter Vitals Group     BP 08/25/23 1044 115/74     Systolic BP Percentile --      Diastolic BP Percentile --      Pulse Rate 08/25/23 1044 69     Resp 08/25/23 1044 16     Temp 08/25/23 1044 98.3 F (36.8 C)     Temp src --      SpO2 08/25/23 1044 97 %     Weight --      Height --      Head Circumference --      Peak Flow --      Pain Score 08/25/23 1040 0     Pain Loc --      Pain Education --      Exclude from Growth Chart --    No data found.  Updated Vital Signs BP 115/74   Pulse 69   Temp 98.3 F (36.8 C)   Resp 16   SpO2 97%   Visual Acuity Right Eye Distance:   Left Eye Distance:   Bilateral Distance:    Right Eye Near:   Left Eye Near:    Bilateral Near:     Physical Exam Vitals reviewed.  Constitutional:  Appearance: Normal appearance. She is not ill-appearing.  HENT:     Head: Normocephalic and atraumatic.     Nose: No congestion or rhinorrhea.  Eyes:     Extraocular Movements: Extraocular movements intact.     Conjunctiva/sclera: Conjunctivae normal.     Pupils: Pupils are equal, round, and reactive to light.  Cardiovascular:     Rate and Rhythm: Normal rate and regular rhythm.  Pulmonary:     Breath sounds: Normal breath sounds.  Musculoskeletal:     Cervical back: Normal range of motion and neck supple.  Skin:    General: Skin is warm and dry.     Capillary Refill: Capillary refill takes less than 2 seconds.  Neurological:     General: No focal deficit present.     Mental Status: She is alert and oriented to person, place, and time.      UC Treatments / Results  Labs (all labs ordered are listed, but only abnormal results are displayed) Labs Reviewed  POCT URINALYSIS DIP (MANUAL ENTRY) - Abnormal; Notable for the following components:      Result Value   Spec Grav, UA >=1.030 (*)    Blood, UA moderate (*)    Protein Ur, POC >=300 (*)    All other components within normal limits  SARS CORONAVIRUS 2 (TAT 6-24 HRS)  CBC WITH DIFFERENTIAL/PLATELET    EKG   Radiology No results found.  Procedures Procedures (including critical care time)  Medications Ordered in UC Medications - No data to display  Initial Impression / Assessment and Plan / UC Course  I have reviewed the triage vital signs and the nursing notes.  Pertinent labs & imaging results that were available during my care of the patient were reviewed by me and considered in my medical decision making (see chart for details).     Given recurrent dizziness obtained a EKG out of precautions given patient extensive cardiac history.  EKG is consistent with previous ECG's, no acute ischemic changes noted on review ECG.  Obtained UA, chronic protein and per review of prior UA,otherwise no acute infection  present. COVID test pending to rule out an acute COVID-19 infection as a source of symptoms.  Will also repeat a CBC to ensure patient's hemoglobin is stable.  Strict return precautions given if symptoms worsen or do not improve.  Otherwise follow-up with primary care provider as needed. Final Clinical Impressions(s) / UC Diagnoses   Final diagnoses:  Dizziness and giddiness  Chills  Encounter for laboratory testing for COVID-19 virus     Discharge Instructions      I am checking for COVID and also checking a blood count to ensure that your hemoglobin is normal.  You will be notified via phone if any of your results require any further treatment.  Will take up to 24 hours for your COVID test result however the blood work should return later this evening.     ED Prescriptions   None    PDMP not reviewed this encounter.   Bing Neighbors, NP 08/25/23 1155

## 2023-08-26 LAB — SARS CORONAVIRUS 2 (TAT 6-24 HRS): SARS Coronavirus 2: NEGATIVE

## 2023-10-03 ENCOUNTER — Other Ambulatory Visit: Payer: Self-pay | Admitting: Gastroenterology

## 2023-11-11 ENCOUNTER — Other Ambulatory Visit: Payer: Self-pay | Admitting: Family

## 2023-11-11 ENCOUNTER — Ambulatory Visit
Admission: RE | Admit: 2023-11-11 | Discharge: 2023-11-11 | Disposition: A | Discharge status: Home / Self Care | Payer: Medicare Other | Source: Ambulatory Visit | Attending: Family | Admitting: Family

## 2023-11-11 DIAGNOSIS — R079 Chest pain, unspecified: Secondary | ICD-10-CM

## 2023-11-17 ENCOUNTER — Other Ambulatory Visit: Payer: Self-pay | Admitting: Obstetrics

## 2023-11-17 DIAGNOSIS — J301 Allergic rhinitis due to pollen: Secondary | ICD-10-CM

## 2024-01-10 ENCOUNTER — Other Ambulatory Visit: Payer: Self-pay | Admitting: Family

## 2024-01-10 DIAGNOSIS — R911 Solitary pulmonary nodule: Secondary | ICD-10-CM

## 2024-02-02 ENCOUNTER — Ambulatory Visit
Admission: RE | Admit: 2024-02-02 | Discharge: 2024-02-02 | Disposition: A | Discharge status: Home / Self Care | Payer: Medicare Other | Source: Ambulatory Visit | Attending: Family

## 2024-02-02 DIAGNOSIS — R911 Solitary pulmonary nodule: Secondary | ICD-10-CM

## 2024-02-24 ENCOUNTER — Encounter: Payer: Self-pay | Admitting: Podiatry

## 2024-02-24 ENCOUNTER — Ambulatory Visit: Admitting: Podiatry

## 2024-02-24 DIAGNOSIS — M779 Enthesopathy, unspecified: Secondary | ICD-10-CM

## 2024-02-24 DIAGNOSIS — L600 Ingrowing nail: Secondary | ICD-10-CM

## 2024-02-24 NOTE — Progress Notes (Signed)
 Subjective:   Patient ID: Veronica Bowen, female   DOB: 68 y.o.   MRN: [id number]   HPI Patient presents with painful ingrown toenail deformity left hallux states has been trying to trim and soak it herself without relief and it is gradually becoming more of an issue for her.  Patient has not noted any active drainage does not smoke likes to be active   Review of Systems  All other systems reviewed and are negative.       Objective:  Physical Exam Vitals and nursing note reviewed.  Constitutional:      Appearance: She is well-developed.  Pulmonary:     Effort: Pulmonary effort is normal.  Musculoskeletal:        General: Normal range of motion.  Skin:    General: Skin is warm.  Neurological:     Mental Status: She is alert.     Neurovascular status intact muscle strength adequate range of motion adequate incurvated left hallux medial border painful when pressed no active drainage or redness noted and structural damage to the underlying nailbed.  Good digital perfusion well-oriented x 3     Assessment:  Chronic toenail deformity left hallux with trauma to the bed as precipitating factor     Plan:  H&P reviewed and I have recommended removal of the nail border and I explained procedure risk associated with fixing this.  Patient wants this done understanding eventually she could require removal of the entire nail border focus on the corner and I went ahead today and allowed her to sign consent form.  I did sterile prep and injected the left hallux 61 g like Marcaine mixture and after the prep finished using sterile instrumentation remove the medial border exposed matrix applied phenol 3 applications 36 followed by alcohol sterile dressing gave instructions on soaks wear dressing 24 hours take it off earlier if throbbing were to occur and encouraged her to call with any questions which may come up

## 2024-02-24 NOTE — Patient Instructions (Signed)

## 2024-04-07 ENCOUNTER — Other Ambulatory Visit: Payer: Self-pay | Admitting: Family Medicine

## 2024-04-07 DIAGNOSIS — Z1231 Encounter for screening mammogram for malignant neoplasm of breast: Secondary | ICD-10-CM

## 2024-07-17 ENCOUNTER — Ambulatory Visit
Admission: RE | Admit: 2024-07-17 | Discharge: 2024-07-17 | Disposition: A | Discharge status: Home / Self Care | Source: Ambulatory Visit | Attending: Family Medicine | Admitting: Family Medicine

## 2024-07-17 DIAGNOSIS — Z1231 Encounter for screening mammogram for malignant neoplasm of breast: Secondary | ICD-10-CM

## 2024-08-02 ENCOUNTER — Encounter (HOSPITAL_COMMUNITY): Payer: Self-pay

## 2024-08-02 ENCOUNTER — Ambulatory Visit (HOSPITAL_COMMUNITY)
Admission: RE | Admit: 2024-08-02 | Discharge: 2024-08-02 | Disposition: A | Discharge status: Home / Self Care | Source: Ambulatory Visit | Attending: Family Medicine | Admitting: Family Medicine

## 2024-08-02 VITALS — BP 126/83 | HR 83 | Temp 98.1°F | Resp 16

## 2024-08-02 DIAGNOSIS — M79605 Pain in left leg: Secondary | ICD-10-CM | POA: Diagnosis not present

## 2024-08-02 DIAGNOSIS — M79602 Pain in left arm: Secondary | ICD-10-CM

## 2024-08-02 DIAGNOSIS — M542 Cervicalgia: Secondary | ICD-10-CM

## 2024-08-02 MED ORDER — DEXAMETHASONE SODIUM PHOSPHATE 10 MG/ML IJ SOLN
10.0000 mg | Freq: Once | INTRAMUSCULAR | Status: AC
  Administered 2024-08-02: 10 mg via INTRAMUSCULAR

## 2024-08-02 MED ORDER — TIZANIDINE HCL 4 MG PO TABS: 4.0000 mg | ORAL_TABLET | Freq: Three times a day (TID) | ORAL | 0 refills | Status: AC | PRN

## 2024-08-02 MED ORDER — KETOROLAC TROMETHAMINE 30 MG/ML IJ SOLN
INTRAMUSCULAR | Status: AC
  Filled 2024-08-02: qty 1

## 2024-08-02 MED ORDER — DEXAMETHASONE SODIUM PHOSPHATE 10 MG/ML IJ SOLN
INTRAMUSCULAR | Status: AC
  Filled 2024-08-02: qty 1

## 2024-08-02 MED ORDER — KETOROLAC TROMETHAMINE 30 MG/ML IJ SOLN
15.0000 mg | Freq: Once | INTRAMUSCULAR | Status: AC
  Administered 2024-08-02: 15 mg via INTRAMUSCULAR

## 2024-08-02 NOTE — Discharge Instructions (Signed)
 Meds ordered this encounter  Medications   ketorolac  (TORADOL ) 30 MG/ML injection 15 mg   dexamethasone  (DECADRON ) injection 10 mg   tiZANidine  (ZANAFLEX ) 4 MG tablet    Sig: Take 1 tablet (4 mg total) by mouth every 8 (eight) hours as needed for muscle spasms.    Dispense:  30 tablet    Refill:  0

## 2024-08-02 NOTE — ED Triage Notes (Signed)
 Pt states left sided headache and neck pain.  States her left hand will cramp up at times and she gets a charlie horse in her left leg. States all her symptoms started 3 days ago. Has been taking tylenol  and motrin  at home with no relief.

## 2024-08-02 NOTE — ED Provider Notes (Signed)
 [facility]   [id number] 08/02/24 Arrival Time: 0803  ASSESSMENT & PLAN:  1. Neck pain on left side   2. Arm pain, left   3. Pain of left lower extremity    Likely MSK pain.  Meds ordered this encounter  Medications   ketorolac  (TORADOL ) 30 MG/ML injection 15 mg   dexamethasone  (DECADRON ) injection 10 mg   tiZANidine  (ZANAFLEX ) 4 MG tablet    Sig: Take 1 tablet (4 mg total) by mouth every 8 (eight) hours as needed for muscle spasms.    Dispense:  30 tablet    Refill:  0   Normal neurological exam. Afebrile without nuchal rigidity. Discussed. Current presentation and symptoms are consistent with prior migraines and are not consistent with SAH, ICH, meningitis, or temporal arteritis. Without fever, focal neuro logical deficits, nuchal rigidity, or change in vision. No indication for neurodiagnostic workup at this time. Discussed.  Recommend:  Follow-up Information     Schedule an appointment as soon as possible for a visit  with Claudene Prentice DELENA Mickey., FNP.   Specialty: Family Medicine Why: For follow up. Contact information: [address] [place] [address] [phone]         [facility] Department at [facility].   Specialty: Emergency Medicine Why: If symptoms worsen in any way. Contact information: [address] [place]  [address] [phone]                 Reviewed expectations re: course of current medical issues. Questions answered. Outlined signs and symptoms indicating need for more acute intervention. Patient verbalized understanding. After Visit Summary given.   SUBJECTIVE: History from: Patient Patient is able to give a clear and coherent history.  Veronica Bowen is a 68 y.o. female who presents with complaint of a LEFT-sided headache. Onset gradual, noted 3 days in go. Mostly describes sharp pain in lateral LEFT neck that radiates upward. Not necessarily related to movements.  Denies extremity sensation changes or weakness. But does feel a cramping in my left arm and leg sometimes. Denies specific extremity paresthesia. HA does not wake her from sleep.  Denies depression, dizziness, loss of balance, muscle weakness, speech difficulties, and vision problems. No head injury reported. Ambulatory without difficulty. No recent travel. Tylenol  with some help.   OBJECTIVE:  Vitals:   08/02/24 0826  BP: 126/83  Pulse: 83  Resp: 16  Temp: 98.1 F (36.7 C)  TempSrc: Oral  SpO2: 95%    General appearance: alert; NAD HENT: normocephalic; atraumatic Eyes: PERRLA; EOMI; conjunctivae normal Neck: supple with FROM Lungs: clear to auscultation bilaterally; unlabored respirations Heart: regular rate and rhythm Extremities: no edema; symmetrical with no gross deformities Skin: warm and dry Neurologic: alert; speech is fluent and clear without dysarthria or aphasia; CN 2-12 grossly intact; no facial droop; normal gait; normal symmetric reflexes; normal extremity strength and sensation throughout; bilateral upper and lower extremity sensation is grossly intact with 5/5 symmetric Psychological: alert and cooperative; normal mood and affect  Labs:  Labs Reviewed - No data to display  Imaging: No results found.  Allergies  Allergen Reactions   Shellfish Allergy Swelling    Past Medical History:  Diagnosis Date   Arrhythmia    Chest pain 06/07/2020   Hyperlipidemia    Hypertension    NSTEMI (non-ST elevated myocardial infarction) (HCC)    Supraventricular tachycardia (HCC) 06/07/2020   Urinary tract infection 06/07/2020   Social History   Socioeconomic History  Marital status: Single    Spouse name: Not on file   Number of children: 2   Years of education: Not on file   Highest education level: Not on file  Occupational History   Not on file  Tobacco Use   Smoking status: Never   Smokeless tobacco: Never  Vaping Use   Vaping status: Never Used   Substance and Sexual Activity   Alcohol use: Yes    Alcohol/week: 1.0 standard drink of alcohol    Types: 1 Glasses of wine per week    Comment: occ wine   Drug use: Never   Sexual activity: Yes    Birth control/protection: None  Other Topics Concern   Not on file  Social History Narrative   Not on file   Social Drivers of Health   Financial Resource Strain: Not on file  Food Insecurity: Not on file  Transportation Needs: Not on file  Physical Activity: Not on file  Stress: Not on file  Social Connections: Not on file  Intimate Partner Violence: Not on file   Family History  Problem Relation Age of Onset   Diabetes Mother    Diabetes Father    Diabetes Sister    Colon cancer Neg Hx    Stomach cancer Neg Hx    Rectal cancer Neg Hx    Esophageal cancer Neg Hx    Liver cancer Neg Hx    Pancreatic cancer Neg Hx    Past Surgical History:  Procedure Laterality Date   CESAREAN SECTION  1989   CORONARY STENT INTERVENTION N/A 06/11/2020   Procedure: CORONARY STENT INTERVENTION;  Surgeon: Anner Alm ORN, MD;  Location: [facility] INVASIVE CV LAB;  Service: Cardiovascular;  Laterality: N/A;   FOOT SURGERY Right    hammer toes  both left and right   LEFT HEART CATH AND CORONARY ANGIOGRAPHY N/A 06/11/2020   Procedure: LEFT HEART CATH AND CORONARY ANGIOGRAPHY;  Surgeon: Anner Alm ORN, MD;  Location: [facility] INVASIVE CV LAB;  Service: Cardiovascular;  Laterality: N/A;      Rolinda Rogue, MD 08/02/24 1015

## 2024-11-22 ENCOUNTER — Ambulatory Visit: Admitting: Podiatry

## 2024-11-22 ENCOUNTER — Encounter: Payer: Self-pay | Admitting: Podiatry

## 2024-11-22 DIAGNOSIS — M7752 Other enthesopathy of left foot: Secondary | ICD-10-CM

## 2024-11-22 DIAGNOSIS — L600 Ingrowing nail: Secondary | ICD-10-CM | POA: Diagnosis not present

## 2024-11-22 MED ORDER — TRIAMCINOLONE ACETONIDE 10 MG/ML IJ SUSP
10.0000 mg | Freq: Once | INTRAMUSCULAR | Status: AC
  Administered 2024-11-22: 10:00:00 10 mg via INTRA_ARTICULAR

## 2024-11-22 MED ORDER — DICLOFENAC SODIUM 75 MG PO TBEC: 75.0000 mg | DELAYED_RELEASE_TABLET | Freq: Two times a day (BID) | ORAL | 2 refills | Status: AC

## 2024-11-22 NOTE — Progress Notes (Signed)
 Subjective:   Patient ID: Veronica Bowen, female   DOB: 68 y.o.   MRN: [id number]   HPI Patient presents with a nail painful spicule of the left hallux medial side that is been bothersome for the last few months.  States that it can get irritated and hard for her to cut on   ROS      Objective:  Physical Exam  Neurovascular status intact with an incurvated spicule with nail growth left hallux medial side     Assessment:  Nail spicule with ingrown toenail component left hallux     Plan:  H&P reviewed I went ahead today and anesthetized 60 mg like Marcaine mixture sterile dressing applied using sterile instrumentation I removed the spicule I exposed the base I applied phenol 3 applications 30 seconds followed by alcohol lavage sterile dressing gave instructions on soaks wear dressing 24 hours take it off earlier if throbbing were to occur and encouraged to call with any questions or concerns during healing.  Patient did sign consent form prior understanding risk
